# Patient Record
Sex: Male | Born: 1965 | Race: White | Hispanic: No | Marital: Married | Smoking: Never smoker
Health system: Southern US, Community
[De-identification: ages and names within clinical notes are randomized; demographics above are authoritative.]

## PROBLEM LIST (undated history)

## (undated) DIAGNOSIS — K5792 Diverticulitis of intestine, part unspecified, without perforation or abscess without bleeding: Secondary | ICD-10-CM

## (undated) DIAGNOSIS — Z91018 Allergy to other foods: Secondary | ICD-10-CM

## (undated) DIAGNOSIS — E785 Hyperlipidemia, unspecified: Secondary | ICD-10-CM

## (undated) DIAGNOSIS — I493 Ventricular premature depolarization: Secondary | ICD-10-CM

## (undated) HISTORY — DX: Allergy to other foods: Z91.018

## (undated) HISTORY — DX: Diverticulitis of intestine, part unspecified, without perforation or abscess without bleeding: K57.92

## (undated) HISTORY — PX: TONSILLECTOMY: SUR1361

## (undated) HISTORY — DX: Ventricular premature depolarization: I49.3

## (undated) HISTORY — DX: Hyperlipidemia, unspecified: E78.5

## (undated) HISTORY — PX: MENISCUS REPAIR: SHX5179

---

## 2004-11-07 ENCOUNTER — Encounter: Admission: RE | Admit: 2004-11-07 | Discharge: 2004-11-21 | Payer: Self-pay | Admitting: Specialist

## 2005-08-29 ENCOUNTER — Ambulatory Visit: Payer: Self-pay | Admitting: Family Medicine

## 2006-01-16 ENCOUNTER — Ambulatory Visit: Payer: Self-pay | Admitting: Family Medicine

## 2006-05-20 ENCOUNTER — Ambulatory Visit: Payer: Self-pay | Admitting: Family Medicine

## 2011-04-18 ENCOUNTER — Encounter: Payer: Self-pay | Admitting: Cardiology

## 2011-04-18 ENCOUNTER — Ambulatory Visit (INDEPENDENT_AMBULATORY_CARE_PROVIDER_SITE_OTHER): Payer: BC Managed Care – PPO | Admitting: Cardiology

## 2011-04-18 DIAGNOSIS — E785 Hyperlipidemia, unspecified: Secondary | ICD-10-CM | POA: Insufficient documentation

## 2011-04-18 DIAGNOSIS — R0989 Other specified symptoms and signs involving the circulatory and respiratory systems: Secondary | ICD-10-CM

## 2011-04-18 DIAGNOSIS — E7800 Pure hypercholesterolemia, unspecified: Secondary | ICD-10-CM

## 2011-04-18 DIAGNOSIS — R002 Palpitations: Secondary | ICD-10-CM

## 2011-04-18 DIAGNOSIS — R0683 Snoring: Secondary | ICD-10-CM | POA: Insufficient documentation

## 2011-04-18 DIAGNOSIS — G4730 Sleep apnea, unspecified: Secondary | ICD-10-CM

## 2011-04-18 DIAGNOSIS — R0609 Other forms of dyspnea: Secondary | ICD-10-CM

## 2011-04-18 NOTE — Assessment & Plan Note (Signed)
I was able to premature atrial contractions with a conduction. This reproduced his symptoms. At this point is absolutely no evidence of structural heart disease. I advised avoidance of caffeine. I told him that if these are problematic now I would consider symptomatic treatment with beta blockers or calcium blockers. I don't think antiarrhythmics are indicated. No further imaging is indicated. I will check a basic metabolic profile, magnesium and TSH.

## 2011-04-18 NOTE — Assessment & Plan Note (Signed)
He has had elevated lipids and would like followup testing as he thinks he changed his diet. I will arrange this.

## 2011-04-18 NOTE — Patient Instructions (Signed)
Please have fasting labs on Friday in our office.  You are being referred to pulmonary for the evaluation of sleep apnea.

## 2011-04-18 NOTE — Assessment & Plan Note (Signed)
His wife has concerns of sleep apnea. They request a sleep consult and I will arrange this.

## 2011-04-18 NOTE — Progress Notes (Signed)
HPI The patient presents for evaluation of palpitations. He says this is been happening for about 3 or 4 months. He has had no prior cardiac history. He notices staining heartbeats particularly in the evenings when he is resting. He doesn't notice activity. He doesn't describe sustained tachycardia arrhythmias. He feels his heart skipping but has had no presyncope or syncope. He has had no chest pressure, neck or arm discomfort. He said no shortness of breath, weight gain or edema. He denies PND or orthopnea. His wife thinks he might have sleep apnea as he snores. He doesn't describe excessive somnolence, headaches and doesn't have difficulty control blood pressure. He does only sleep about 5 hours a night however. He has had some recent problems with what he believes to be allergic reactions with apparent hives and is to see an allergist.    No Known Allergies  No current outpatient prescriptions on file.    Past Medical History  Diagnosis Date  . Hyperlipidemia     Borderline    Past Surgical History  Procedure Date  . Meniscus repair     Left    Family History  Problem Relation Age of Onset  . Hypertension Father   . Hyperlipidemia Mother   . Congenital heart disease Son     TGV    History   Social History  . Marital Status: Married    Spouse Name: N/A    Number of Children: 3  . Years of Education: N/A   Occupational History  . Roofer    Social History Main Topics  . Smoking status: Never Smoker   . Smokeless tobacco: Not on file  . Alcohol Use: Not on file  . Drug Use: Not on file  . Sexually Active: Not on file   Other Topics Concern  . Not on file   Social History Narrative  . No narrative on file    ROS:  As stated in the HPI and negative for all other systems.  PHYSICAL EXAM BP 112/68  Pulse 67  Resp 16  Ht 6\' 1"  (1.854 m)  Wt 204 lb (92.534 kg)  BMI 26.91 kg/m2 GENERAL:  Well appearing HEENT:  Pupils equal round and reactive, fundi not  visualized, oral mucosa unremarkable NECK:  No jugular venous distention, waveform within normal limits, carotid upstroke brisk and symmetric, no bruits, no thyromegaly LYMPHATICS:  No cervical, inguinal adenopathy LUNGS:  Clear to auscultation bilaterally BACK:  No CVA tenderness CHEST:  Unremarkable HEART:  PMI not displaced or sustained,S1 and S2 within normal limits, no S3, no S4, no clicks, no rubs, no murmurs ABD:  Flat, positive bowel sounds normal in frequency in pitch, no bruits, no rebound, no guarding, no midline pulsatile mass, no hepatomegaly, no splenomegaly EXT:  2 plus pulses throughout, no edema, no cyanosis no clubbing SKIN:  No rashes no nodules NEURO:  Cranial nerves II through XII grossly intact, motor grossly intact throughout PSYCH:  Cognitively intact, oriented to person place and time  EKG:  Sinus rhythm, rate 67, axis within normal limits, intervals within normal limits, no acute ST-T wave changes.   ASSESSMENT AND PLAN

## 2011-04-20 ENCOUNTER — Ambulatory Visit (INDEPENDENT_AMBULATORY_CARE_PROVIDER_SITE_OTHER): Payer: BC Managed Care – PPO | Admitting: *Deleted

## 2011-04-20 DIAGNOSIS — R002 Palpitations: Secondary | ICD-10-CM

## 2011-04-20 DIAGNOSIS — E7800 Pure hypercholesterolemia, unspecified: Secondary | ICD-10-CM

## 2011-04-20 LAB — BASIC METABOLIC PANEL
CO2: 27 mEq/L (ref 19–32)
Glucose, Bld: 87 mg/dL (ref 70–99)
Potassium: 4.1 mEq/L (ref 3.5–5.1)
Sodium: 139 mEq/L (ref 135–145)

## 2011-04-20 LAB — LIPID PANEL
Cholesterol: 231 mg/dL — ABNORMAL HIGH (ref 0–200)
HDL: 52.4 mg/dL (ref >39.00)
VLDL: 13.4 mg/dL (ref 0.0–40.0)

## 2011-04-20 LAB — LDL CHOLESTEROL, DIRECT: Direct LDL: 157 mg/dL

## 2011-04-20 LAB — MAGNESIUM: Magnesium: 2.2 mg/dL (ref 1.5–2.5)

## 2011-04-26 ENCOUNTER — Telehealth: Payer: Self-pay | Admitting: Cardiology

## 2011-04-26 NOTE — Telephone Encounter (Signed)
Test result.  Per pt  Request,  please give information to wife.

## 2011-04-26 NOTE — Telephone Encounter (Signed)
Reviewed all lab results with pt and his wife.  Pt is aware that Lipitor 40 mg a day has been ordered and will be sent into his pharmacy.  He will call back if he should decide to start it so that I can schedule his repeat lab work in 10 weeks

## 2011-05-11 ENCOUNTER — Institutional Professional Consult (permissible substitution): Payer: BC Managed Care – PPO | Admitting: Pulmonary Disease

## 2011-07-10 ENCOUNTER — Emergency Department (HOSPITAL_COMMUNITY)
Admission: EM | Admit: 2011-07-10 | Discharge: 2011-07-10 | Disposition: A | Discharge status: Home / Self Care | Payer: BC Managed Care – PPO | Attending: Emergency Medicine | Admitting: Emergency Medicine

## 2011-07-10 DIAGNOSIS — X58XXXA Exposure to other specified factors, initial encounter: Secondary | ICD-10-CM | POA: Insufficient documentation

## 2011-07-10 DIAGNOSIS — T7840XA Allergy, unspecified, initial encounter: Secondary | ICD-10-CM | POA: Insufficient documentation

## 2011-07-10 DIAGNOSIS — R21 Rash and other nonspecific skin eruption: Secondary | ICD-10-CM | POA: Insufficient documentation

## 2011-07-10 LAB — GLUCOSE, CAPILLARY: Glucose-Capillary: 144 mg/dL — ABNORMAL HIGH (ref 70–99)

## 2016-03-06 DIAGNOSIS — Z889 Allergy status to unspecified drugs, medicaments and biological substances status: Secondary | ICD-10-CM | POA: Diagnosis not present

## 2016-03-06 DIAGNOSIS — Z6829 Body mass index (BMI) 29.0-29.9, adult: Secondary | ICD-10-CM | POA: Diagnosis not present

## 2016-03-06 DIAGNOSIS — M79652 Pain in left thigh: Secondary | ICD-10-CM | POA: Diagnosis not present

## 2016-06-27 DIAGNOSIS — M5136 Other intervertebral disc degeneration, lumbar region: Secondary | ICD-10-CM | POA: Diagnosis not present

## 2016-06-27 DIAGNOSIS — M9903 Segmental and somatic dysfunction of lumbar region: Secondary | ICD-10-CM | POA: Diagnosis not present

## 2016-07-09 DIAGNOSIS — M9903 Segmental and somatic dysfunction of lumbar region: Secondary | ICD-10-CM | POA: Diagnosis not present

## 2016-07-09 DIAGNOSIS — M5136 Other intervertebral disc degeneration, lumbar region: Secondary | ICD-10-CM | POA: Diagnosis not present

## 2016-07-10 DIAGNOSIS — M5136 Other intervertebral disc degeneration, lumbar region: Secondary | ICD-10-CM | POA: Diagnosis not present

## 2016-07-10 DIAGNOSIS — M9903 Segmental and somatic dysfunction of lumbar region: Secondary | ICD-10-CM | POA: Diagnosis not present

## 2016-07-12 DIAGNOSIS — M5136 Other intervertebral disc degeneration, lumbar region: Secondary | ICD-10-CM | POA: Diagnosis not present

## 2016-07-12 DIAGNOSIS — M9903 Segmental and somatic dysfunction of lumbar region: Secondary | ICD-10-CM | POA: Diagnosis not present

## 2016-07-17 DIAGNOSIS — M9903 Segmental and somatic dysfunction of lumbar region: Secondary | ICD-10-CM | POA: Diagnosis not present

## 2016-07-17 DIAGNOSIS — M5136 Other intervertebral disc degeneration, lumbar region: Secondary | ICD-10-CM | POA: Diagnosis not present

## 2016-07-18 DIAGNOSIS — M9903 Segmental and somatic dysfunction of lumbar region: Secondary | ICD-10-CM | POA: Diagnosis not present

## 2016-07-18 DIAGNOSIS — M5136 Other intervertebral disc degeneration, lumbar region: Secondary | ICD-10-CM | POA: Diagnosis not present

## 2016-07-19 DIAGNOSIS — M5136 Other intervertebral disc degeneration, lumbar region: Secondary | ICD-10-CM | POA: Diagnosis not present

## 2016-07-19 DIAGNOSIS — M9903 Segmental and somatic dysfunction of lumbar region: Secondary | ICD-10-CM | POA: Diagnosis not present

## 2016-07-23 DIAGNOSIS — M5136 Other intervertebral disc degeneration, lumbar region: Secondary | ICD-10-CM | POA: Diagnosis not present

## 2016-07-23 DIAGNOSIS — M9903 Segmental and somatic dysfunction of lumbar region: Secondary | ICD-10-CM | POA: Diagnosis not present

## 2016-07-24 DIAGNOSIS — M9903 Segmental and somatic dysfunction of lumbar region: Secondary | ICD-10-CM | POA: Diagnosis not present

## 2016-07-24 DIAGNOSIS — M5136 Other intervertebral disc degeneration, lumbar region: Secondary | ICD-10-CM | POA: Diagnosis not present

## 2016-07-24 DIAGNOSIS — H524 Presbyopia: Secondary | ICD-10-CM | POA: Diagnosis not present

## 2016-07-24 DIAGNOSIS — H5203 Hypermetropia, bilateral: Secondary | ICD-10-CM | POA: Diagnosis not present

## 2016-07-25 DIAGNOSIS — M5136 Other intervertebral disc degeneration, lumbar region: Secondary | ICD-10-CM | POA: Diagnosis not present

## 2016-07-25 DIAGNOSIS — M9903 Segmental and somatic dysfunction of lumbar region: Secondary | ICD-10-CM | POA: Diagnosis not present

## 2016-07-30 DIAGNOSIS — M9903 Segmental and somatic dysfunction of lumbar region: Secondary | ICD-10-CM | POA: Diagnosis not present

## 2016-07-30 DIAGNOSIS — M5136 Other intervertebral disc degeneration, lumbar region: Secondary | ICD-10-CM | POA: Diagnosis not present

## 2016-07-31 DIAGNOSIS — M9903 Segmental and somatic dysfunction of lumbar region: Secondary | ICD-10-CM | POA: Diagnosis not present

## 2016-07-31 DIAGNOSIS — M5136 Other intervertebral disc degeneration, lumbar region: Secondary | ICD-10-CM | POA: Diagnosis not present

## 2016-08-01 DIAGNOSIS — M5136 Other intervertebral disc degeneration, lumbar region: Secondary | ICD-10-CM | POA: Diagnosis not present

## 2016-08-01 DIAGNOSIS — M9903 Segmental and somatic dysfunction of lumbar region: Secondary | ICD-10-CM | POA: Diagnosis not present

## 2016-08-02 DIAGNOSIS — M9903 Segmental and somatic dysfunction of lumbar region: Secondary | ICD-10-CM | POA: Diagnosis not present

## 2016-08-02 DIAGNOSIS — M5136 Other intervertebral disc degeneration, lumbar region: Secondary | ICD-10-CM | POA: Diagnosis not present

## 2016-08-06 DIAGNOSIS — M5136 Other intervertebral disc degeneration, lumbar region: Secondary | ICD-10-CM | POA: Diagnosis not present

## 2016-08-06 DIAGNOSIS — M9903 Segmental and somatic dysfunction of lumbar region: Secondary | ICD-10-CM | POA: Diagnosis not present

## 2016-08-07 DIAGNOSIS — M5136 Other intervertebral disc degeneration, lumbar region: Secondary | ICD-10-CM | POA: Diagnosis not present

## 2016-08-07 DIAGNOSIS — M9903 Segmental and somatic dysfunction of lumbar region: Secondary | ICD-10-CM | POA: Diagnosis not present

## 2016-08-09 DIAGNOSIS — M9903 Segmental and somatic dysfunction of lumbar region: Secondary | ICD-10-CM | POA: Diagnosis not present

## 2016-08-09 DIAGNOSIS — M5136 Other intervertebral disc degeneration, lumbar region: Secondary | ICD-10-CM | POA: Diagnosis not present

## 2016-08-13 DIAGNOSIS — M5136 Other intervertebral disc degeneration, lumbar region: Secondary | ICD-10-CM | POA: Diagnosis not present

## 2016-08-13 DIAGNOSIS — M9903 Segmental and somatic dysfunction of lumbar region: Secondary | ICD-10-CM | POA: Diagnosis not present

## 2016-08-14 DIAGNOSIS — M5136 Other intervertebral disc degeneration, lumbar region: Secondary | ICD-10-CM | POA: Diagnosis not present

## 2016-08-14 DIAGNOSIS — M9903 Segmental and somatic dysfunction of lumbar region: Secondary | ICD-10-CM | POA: Diagnosis not present

## 2016-08-20 DIAGNOSIS — M9903 Segmental and somatic dysfunction of lumbar region: Secondary | ICD-10-CM | POA: Diagnosis not present

## 2016-08-20 DIAGNOSIS — M5136 Other intervertebral disc degeneration, lumbar region: Secondary | ICD-10-CM | POA: Diagnosis not present

## 2016-08-22 DIAGNOSIS — M5136 Other intervertebral disc degeneration, lumbar region: Secondary | ICD-10-CM | POA: Diagnosis not present

## 2016-08-22 DIAGNOSIS — M9903 Segmental and somatic dysfunction of lumbar region: Secondary | ICD-10-CM | POA: Diagnosis not present

## 2016-08-23 DIAGNOSIS — M9903 Segmental and somatic dysfunction of lumbar region: Secondary | ICD-10-CM | POA: Diagnosis not present

## 2016-08-23 DIAGNOSIS — M5136 Other intervertebral disc degeneration, lumbar region: Secondary | ICD-10-CM | POA: Diagnosis not present

## 2016-08-27 DIAGNOSIS — M5136 Other intervertebral disc degeneration, lumbar region: Secondary | ICD-10-CM | POA: Diagnosis not present

## 2016-08-27 DIAGNOSIS — M9903 Segmental and somatic dysfunction of lumbar region: Secondary | ICD-10-CM | POA: Diagnosis not present

## 2016-08-29 DIAGNOSIS — M5136 Other intervertebral disc degeneration, lumbar region: Secondary | ICD-10-CM | POA: Diagnosis not present

## 2016-08-29 DIAGNOSIS — M9903 Segmental and somatic dysfunction of lumbar region: Secondary | ICD-10-CM | POA: Diagnosis not present

## 2016-08-30 DIAGNOSIS — M9903 Segmental and somatic dysfunction of lumbar region: Secondary | ICD-10-CM | POA: Diagnosis not present

## 2016-08-30 DIAGNOSIS — M5136 Other intervertebral disc degeneration, lumbar region: Secondary | ICD-10-CM | POA: Diagnosis not present

## 2016-09-03 DIAGNOSIS — M9903 Segmental and somatic dysfunction of lumbar region: Secondary | ICD-10-CM | POA: Diagnosis not present

## 2016-09-03 DIAGNOSIS — M5136 Other intervertebral disc degeneration, lumbar region: Secondary | ICD-10-CM | POA: Diagnosis not present

## 2016-09-04 DIAGNOSIS — M5136 Other intervertebral disc degeneration, lumbar region: Secondary | ICD-10-CM | POA: Diagnosis not present

## 2016-09-04 DIAGNOSIS — M9903 Segmental and somatic dysfunction of lumbar region: Secondary | ICD-10-CM | POA: Diagnosis not present

## 2016-09-20 DIAGNOSIS — M5136 Other intervertebral disc degeneration, lumbar region: Secondary | ICD-10-CM | POA: Diagnosis not present

## 2016-09-20 DIAGNOSIS — M9903 Segmental and somatic dysfunction of lumbar region: Secondary | ICD-10-CM | POA: Diagnosis not present

## 2016-09-25 DIAGNOSIS — M5136 Other intervertebral disc degeneration, lumbar region: Secondary | ICD-10-CM | POA: Diagnosis not present

## 2016-09-25 DIAGNOSIS — M9903 Segmental and somatic dysfunction of lumbar region: Secondary | ICD-10-CM | POA: Diagnosis not present

## 2016-09-26 DIAGNOSIS — M5136 Other intervertebral disc degeneration, lumbar region: Secondary | ICD-10-CM | POA: Diagnosis not present

## 2016-09-26 DIAGNOSIS — M9903 Segmental and somatic dysfunction of lumbar region: Secondary | ICD-10-CM | POA: Diagnosis not present

## 2016-09-27 DIAGNOSIS — M5136 Other intervertebral disc degeneration, lumbar region: Secondary | ICD-10-CM | POA: Diagnosis not present

## 2016-09-27 DIAGNOSIS — M9903 Segmental and somatic dysfunction of lumbar region: Secondary | ICD-10-CM | POA: Diagnosis not present

## 2016-10-01 DIAGNOSIS — M5136 Other intervertebral disc degeneration, lumbar region: Secondary | ICD-10-CM | POA: Diagnosis not present

## 2016-10-01 DIAGNOSIS — M9903 Segmental and somatic dysfunction of lumbar region: Secondary | ICD-10-CM | POA: Diagnosis not present

## 2016-10-02 DIAGNOSIS — M5136 Other intervertebral disc degeneration, lumbar region: Secondary | ICD-10-CM | POA: Diagnosis not present

## 2016-10-02 DIAGNOSIS — M9903 Segmental and somatic dysfunction of lumbar region: Secondary | ICD-10-CM | POA: Diagnosis not present

## 2016-10-04 DIAGNOSIS — M9903 Segmental and somatic dysfunction of lumbar region: Secondary | ICD-10-CM | POA: Diagnosis not present

## 2016-10-04 DIAGNOSIS — M5136 Other intervertebral disc degeneration, lumbar region: Secondary | ICD-10-CM | POA: Diagnosis not present

## 2016-10-08 DIAGNOSIS — M9903 Segmental and somatic dysfunction of lumbar region: Secondary | ICD-10-CM | POA: Diagnosis not present

## 2016-10-08 DIAGNOSIS — M5136 Other intervertebral disc degeneration, lumbar region: Secondary | ICD-10-CM | POA: Diagnosis not present

## 2016-10-09 DIAGNOSIS — M9903 Segmental and somatic dysfunction of lumbar region: Secondary | ICD-10-CM | POA: Diagnosis not present

## 2016-10-09 DIAGNOSIS — M5136 Other intervertebral disc degeneration, lumbar region: Secondary | ICD-10-CM | POA: Diagnosis not present

## 2016-10-15 DIAGNOSIS — M9903 Segmental and somatic dysfunction of lumbar region: Secondary | ICD-10-CM | POA: Diagnosis not present

## 2016-10-15 DIAGNOSIS — M5136 Other intervertebral disc degeneration, lumbar region: Secondary | ICD-10-CM | POA: Diagnosis not present

## 2016-10-17 DIAGNOSIS — M9903 Segmental and somatic dysfunction of lumbar region: Secondary | ICD-10-CM | POA: Diagnosis not present

## 2016-10-17 DIAGNOSIS — M5136 Other intervertebral disc degeneration, lumbar region: Secondary | ICD-10-CM | POA: Diagnosis not present

## 2016-10-29 DIAGNOSIS — M9903 Segmental and somatic dysfunction of lumbar region: Secondary | ICD-10-CM | POA: Diagnosis not present

## 2016-10-29 DIAGNOSIS — M5136 Other intervertebral disc degeneration, lumbar region: Secondary | ICD-10-CM | POA: Diagnosis not present

## 2016-10-31 DIAGNOSIS — M5136 Other intervertebral disc degeneration, lumbar region: Secondary | ICD-10-CM | POA: Diagnosis not present

## 2016-10-31 DIAGNOSIS — M9903 Segmental and somatic dysfunction of lumbar region: Secondary | ICD-10-CM | POA: Diagnosis not present

## 2016-11-01 DIAGNOSIS — M9903 Segmental and somatic dysfunction of lumbar region: Secondary | ICD-10-CM | POA: Diagnosis not present

## 2016-11-01 DIAGNOSIS — M5136 Other intervertebral disc degeneration, lumbar region: Secondary | ICD-10-CM | POA: Diagnosis not present

## 2016-11-05 DIAGNOSIS — M5136 Other intervertebral disc degeneration, lumbar region: Secondary | ICD-10-CM | POA: Diagnosis not present

## 2016-11-05 DIAGNOSIS — M9903 Segmental and somatic dysfunction of lumbar region: Secondary | ICD-10-CM | POA: Diagnosis not present

## 2016-11-06 DIAGNOSIS — M5136 Other intervertebral disc degeneration, lumbar region: Secondary | ICD-10-CM | POA: Diagnosis not present

## 2016-11-06 DIAGNOSIS — M9903 Segmental and somatic dysfunction of lumbar region: Secondary | ICD-10-CM | POA: Diagnosis not present

## 2016-11-20 DIAGNOSIS — M9903 Segmental and somatic dysfunction of lumbar region: Secondary | ICD-10-CM | POA: Diagnosis not present

## 2016-11-20 DIAGNOSIS — M5136 Other intervertebral disc degeneration, lumbar region: Secondary | ICD-10-CM | POA: Diagnosis not present

## 2016-11-27 DIAGNOSIS — M5136 Other intervertebral disc degeneration, lumbar region: Secondary | ICD-10-CM | POA: Diagnosis not present

## 2016-11-27 DIAGNOSIS — M9903 Segmental and somatic dysfunction of lumbar region: Secondary | ICD-10-CM | POA: Diagnosis not present

## 2016-11-29 DIAGNOSIS — M5136 Other intervertebral disc degeneration, lumbar region: Secondary | ICD-10-CM | POA: Diagnosis not present

## 2016-11-29 DIAGNOSIS — M9903 Segmental and somatic dysfunction of lumbar region: Secondary | ICD-10-CM | POA: Diagnosis not present

## 2016-12-04 DIAGNOSIS — M5136 Other intervertebral disc degeneration, lumbar region: Secondary | ICD-10-CM | POA: Diagnosis not present

## 2016-12-04 DIAGNOSIS — M9903 Segmental and somatic dysfunction of lumbar region: Secondary | ICD-10-CM | POA: Diagnosis not present

## 2016-12-05 DIAGNOSIS — M5136 Other intervertebral disc degeneration, lumbar region: Secondary | ICD-10-CM | POA: Diagnosis not present

## 2016-12-05 DIAGNOSIS — M9903 Segmental and somatic dysfunction of lumbar region: Secondary | ICD-10-CM | POA: Diagnosis not present

## 2016-12-10 DIAGNOSIS — M9903 Segmental and somatic dysfunction of lumbar region: Secondary | ICD-10-CM | POA: Diagnosis not present

## 2016-12-10 DIAGNOSIS — M5136 Other intervertebral disc degeneration, lumbar region: Secondary | ICD-10-CM | POA: Diagnosis not present

## 2016-12-12 DIAGNOSIS — M5136 Other intervertebral disc degeneration, lumbar region: Secondary | ICD-10-CM | POA: Diagnosis not present

## 2016-12-12 DIAGNOSIS — M9903 Segmental and somatic dysfunction of lumbar region: Secondary | ICD-10-CM | POA: Diagnosis not present

## 2016-12-13 DIAGNOSIS — M9903 Segmental and somatic dysfunction of lumbar region: Secondary | ICD-10-CM | POA: Diagnosis not present

## 2016-12-13 DIAGNOSIS — M5136 Other intervertebral disc degeneration, lumbar region: Secondary | ICD-10-CM | POA: Diagnosis not present

## 2016-12-18 DIAGNOSIS — M5136 Other intervertebral disc degeneration, lumbar region: Secondary | ICD-10-CM | POA: Diagnosis not present

## 2016-12-18 DIAGNOSIS — M9903 Segmental and somatic dysfunction of lumbar region: Secondary | ICD-10-CM | POA: Diagnosis not present

## 2016-12-19 DIAGNOSIS — M5136 Other intervertebral disc degeneration, lumbar region: Secondary | ICD-10-CM | POA: Diagnosis not present

## 2016-12-19 DIAGNOSIS — M9903 Segmental and somatic dysfunction of lumbar region: Secondary | ICD-10-CM | POA: Diagnosis not present

## 2016-12-24 DIAGNOSIS — M5136 Other intervertebral disc degeneration, lumbar region: Secondary | ICD-10-CM | POA: Diagnosis not present

## 2016-12-24 DIAGNOSIS — M9903 Segmental and somatic dysfunction of lumbar region: Secondary | ICD-10-CM | POA: Diagnosis not present

## 2016-12-26 DIAGNOSIS — M5136 Other intervertebral disc degeneration, lumbar region: Secondary | ICD-10-CM | POA: Diagnosis not present

## 2016-12-26 DIAGNOSIS — M9903 Segmental and somatic dysfunction of lumbar region: Secondary | ICD-10-CM | POA: Diagnosis not present

## 2017-01-01 DIAGNOSIS — M5136 Other intervertebral disc degeneration, lumbar region: Secondary | ICD-10-CM | POA: Diagnosis not present

## 2017-01-01 DIAGNOSIS — M9903 Segmental and somatic dysfunction of lumbar region: Secondary | ICD-10-CM | POA: Diagnosis not present

## 2017-01-03 DIAGNOSIS — M9903 Segmental and somatic dysfunction of lumbar region: Secondary | ICD-10-CM | POA: Diagnosis not present

## 2017-01-03 DIAGNOSIS — M5136 Other intervertebral disc degeneration, lumbar region: Secondary | ICD-10-CM | POA: Diagnosis not present

## 2017-01-14 DIAGNOSIS — M9903 Segmental and somatic dysfunction of lumbar region: Secondary | ICD-10-CM | POA: Diagnosis not present

## 2017-01-14 DIAGNOSIS — M5136 Other intervertebral disc degeneration, lumbar region: Secondary | ICD-10-CM | POA: Diagnosis not present

## 2017-01-24 DIAGNOSIS — M5136 Other intervertebral disc degeneration, lumbar region: Secondary | ICD-10-CM | POA: Diagnosis not present

## 2017-01-24 DIAGNOSIS — M9903 Segmental and somatic dysfunction of lumbar region: Secondary | ICD-10-CM | POA: Diagnosis not present

## 2017-02-11 DIAGNOSIS — M9903 Segmental and somatic dysfunction of lumbar region: Secondary | ICD-10-CM | POA: Diagnosis not present

## 2017-02-11 DIAGNOSIS — M5136 Other intervertebral disc degeneration, lumbar region: Secondary | ICD-10-CM | POA: Diagnosis not present

## 2017-02-26 DIAGNOSIS — M5136 Other intervertebral disc degeneration, lumbar region: Secondary | ICD-10-CM | POA: Diagnosis not present

## 2017-02-26 DIAGNOSIS — M9903 Segmental and somatic dysfunction of lumbar region: Secondary | ICD-10-CM | POA: Diagnosis not present

## 2017-06-24 DIAGNOSIS — M5136 Other intervertebral disc degeneration, lumbar region: Secondary | ICD-10-CM | POA: Diagnosis not present

## 2017-06-24 DIAGNOSIS — M9903 Segmental and somatic dysfunction of lumbar region: Secondary | ICD-10-CM | POA: Diagnosis not present

## 2017-06-26 DIAGNOSIS — M9903 Segmental and somatic dysfunction of lumbar region: Secondary | ICD-10-CM | POA: Diagnosis not present

## 2017-06-26 DIAGNOSIS — M5136 Other intervertebral disc degeneration, lumbar region: Secondary | ICD-10-CM | POA: Diagnosis not present

## 2017-07-01 DIAGNOSIS — M5136 Other intervertebral disc degeneration, lumbar region: Secondary | ICD-10-CM | POA: Diagnosis not present

## 2017-07-01 DIAGNOSIS — M9903 Segmental and somatic dysfunction of lumbar region: Secondary | ICD-10-CM | POA: Diagnosis not present

## 2017-07-08 DIAGNOSIS — M9903 Segmental and somatic dysfunction of lumbar region: Secondary | ICD-10-CM | POA: Diagnosis not present

## 2017-07-08 DIAGNOSIS — M5136 Other intervertebral disc degeneration, lumbar region: Secondary | ICD-10-CM | POA: Diagnosis not present

## 2017-07-11 DIAGNOSIS — M9903 Segmental and somatic dysfunction of lumbar region: Secondary | ICD-10-CM | POA: Diagnosis not present

## 2017-07-11 DIAGNOSIS — M5136 Other intervertebral disc degeneration, lumbar region: Secondary | ICD-10-CM | POA: Diagnosis not present

## 2017-07-16 DIAGNOSIS — M9903 Segmental and somatic dysfunction of lumbar region: Secondary | ICD-10-CM | POA: Diagnosis not present

## 2017-07-16 DIAGNOSIS — M5136 Other intervertebral disc degeneration, lumbar region: Secondary | ICD-10-CM | POA: Diagnosis not present

## 2017-07-18 DIAGNOSIS — M5136 Other intervertebral disc degeneration, lumbar region: Secondary | ICD-10-CM | POA: Diagnosis not present

## 2017-07-18 DIAGNOSIS — M9903 Segmental and somatic dysfunction of lumbar region: Secondary | ICD-10-CM | POA: Diagnosis not present

## 2017-07-23 DIAGNOSIS — M9903 Segmental and somatic dysfunction of lumbar region: Secondary | ICD-10-CM | POA: Diagnosis not present

## 2017-07-23 DIAGNOSIS — M5136 Other intervertebral disc degeneration, lumbar region: Secondary | ICD-10-CM | POA: Diagnosis not present

## 2018-01-14 DIAGNOSIS — H1089 Other conjunctivitis: Secondary | ICD-10-CM | POA: Diagnosis not present

## 2018-01-14 DIAGNOSIS — R599 Enlarged lymph nodes, unspecified: Secondary | ICD-10-CM | POA: Diagnosis not present

## 2018-01-14 DIAGNOSIS — K0889 Other specified disorders of teeth and supporting structures: Secondary | ICD-10-CM | POA: Diagnosis not present

## 2018-01-22 DIAGNOSIS — Z125 Encounter for screening for malignant neoplasm of prostate: Secondary | ICD-10-CM | POA: Diagnosis not present

## 2018-01-22 DIAGNOSIS — Z0000 Encounter for general adult medical examination without abnormal findings: Secondary | ICD-10-CM | POA: Diagnosis not present

## 2018-01-22 DIAGNOSIS — M2550 Pain in unspecified joint: Secondary | ICD-10-CM | POA: Diagnosis not present

## 2018-01-28 DIAGNOSIS — Z1389 Encounter for screening for other disorder: Secondary | ICD-10-CM | POA: Diagnosis not present

## 2018-01-28 DIAGNOSIS — J45909 Unspecified asthma, uncomplicated: Secondary | ICD-10-CM | POA: Diagnosis not present

## 2018-01-28 DIAGNOSIS — Z0000 Encounter for general adult medical examination without abnormal findings: Secondary | ICD-10-CM | POA: Diagnosis not present

## 2018-01-28 DIAGNOSIS — E7849 Other hyperlipidemia: Secondary | ICD-10-CM | POA: Diagnosis not present

## 2018-01-28 DIAGNOSIS — R590 Localized enlarged lymph nodes: Secondary | ICD-10-CM | POA: Diagnosis not present

## 2018-01-28 DIAGNOSIS — R0683 Snoring: Secondary | ICD-10-CM | POA: Diagnosis not present

## 2018-06-11 DIAGNOSIS — M7711 Lateral epicondylitis, right elbow: Secondary | ICD-10-CM | POA: Diagnosis not present

## 2019-01-29 DIAGNOSIS — E7849 Other hyperlipidemia: Secondary | ICD-10-CM | POA: Diagnosis not present

## 2019-01-29 DIAGNOSIS — Z125 Encounter for screening for malignant neoplasm of prostate: Secondary | ICD-10-CM | POA: Diagnosis not present

## 2019-01-29 DIAGNOSIS — R002 Palpitations: Secondary | ICD-10-CM | POA: Diagnosis not present

## 2019-02-03 ENCOUNTER — Encounter: Payer: Self-pay | Admitting: Internal Medicine

## 2019-02-03 DIAGNOSIS — R002 Palpitations: Secondary | ICD-10-CM | POA: Diagnosis not present

## 2019-02-03 DIAGNOSIS — W57XXXA Bitten or stung by nonvenomous insect and other nonvenomous arthropods, initial encounter: Secondary | ICD-10-CM | POA: Diagnosis not present

## 2019-02-03 DIAGNOSIS — L539 Erythematous condition, unspecified: Secondary | ICD-10-CM | POA: Diagnosis not present

## 2019-02-05 DIAGNOSIS — Z889 Allergy status to unspecified drugs, medicaments and biological substances status: Secondary | ICD-10-CM | POA: Diagnosis not present

## 2019-02-05 DIAGNOSIS — Z0000 Encounter for general adult medical examination without abnormal findings: Secondary | ICD-10-CM | POA: Diagnosis not present

## 2019-02-05 DIAGNOSIS — R002 Palpitations: Secondary | ICD-10-CM | POA: Diagnosis not present

## 2019-02-05 DIAGNOSIS — Z1331 Encounter for screening for depression: Secondary | ICD-10-CM | POA: Diagnosis not present

## 2019-02-05 DIAGNOSIS — E785 Hyperlipidemia, unspecified: Secondary | ICD-10-CM | POA: Diagnosis not present

## 2019-02-05 DIAGNOSIS — W57XXXA Bitten or stung by nonvenomous insect and other nonvenomous arthropods, initial encounter: Secondary | ICD-10-CM | POA: Diagnosis not present

## 2019-02-09 ENCOUNTER — Telehealth: Payer: Self-pay | Admitting: *Deleted

## 2019-02-09 NOTE — Telephone Encounter (Signed)
REFERRAL SENT TO SCHEDULING AND NOTES ON FILE FROM [facility] [phone] DR. AVVA

## 2019-02-11 ENCOUNTER — Encounter: Payer: Self-pay | Admitting: Gastroenterology

## 2019-02-12 ENCOUNTER — Encounter: Payer: Self-pay | Admitting: Gastroenterology

## 2019-02-27 ENCOUNTER — Ambulatory Visit (AMBULATORY_SURGERY_CENTER): Payer: Self-pay | Admitting: *Deleted

## 2019-02-27 ENCOUNTER — Other Ambulatory Visit: Payer: Self-pay

## 2019-02-27 VITALS — Ht 71.0 in | Wt 190.0 lb

## 2019-02-27 DIAGNOSIS — Z1211 Encounter for screening for malignant neoplasm of colon: Secondary | ICD-10-CM

## 2019-02-27 MED ORDER — NA SULFATE-K SULFATE-MG SULF 17.5-3.13-1.6 GM/177ML PO SOLN: ORAL | 0 refills | Status: DC

## 2019-02-27 NOTE — Progress Notes (Signed)
Patient's pre-visit was done today over the phone with the patient due to Covid-19. Name,DOB and address verified. Insurance verified. Packet of Prep instructions mailed to patient including copy of a consent form and pre-procedure patient acknowledgement form and suprep coupon-pt is aware. Patient understands to call us back with any questions or concerns.   Patient denies any allergies to eggs or soy. Patient denies any problems with anesthesia/sedation. Patient denies any oxygen use at home. Patient denies taking any diet/weight loss medications or blood thinners. EMMI education assisgned to patient on colonoscopy, this was explained and instructions given to patient.  Patient having palpitations for 6-8 months, he has an cardiologist office visit with Dr.Allred on Monday 03/02/2019. Patient aware to get cardiac clearance from the dr before colonoscopy can be done.  He is aware to call us back after the appointment to let us know if colonoscopy can be done.

## 2019-03-02 ENCOUNTER — Encounter: Payer: Self-pay | Admitting: Internal Medicine

## 2019-03-02 ENCOUNTER — Telehealth (INDEPENDENT_AMBULATORY_CARE_PROVIDER_SITE_OTHER): Payer: BC Managed Care – PPO | Admitting: Internal Medicine

## 2019-03-02 ENCOUNTER — Other Ambulatory Visit: Payer: Self-pay

## 2019-03-02 ENCOUNTER — Telehealth: Payer: Self-pay

## 2019-03-02 VITALS — BP 115/73 | HR 54 | Ht 71.0 in | Wt 195.0 lb

## 2019-03-02 DIAGNOSIS — R002 Palpitations: Secondary | ICD-10-CM

## 2019-03-02 DIAGNOSIS — I493 Ventricular premature depolarization: Secondary | ICD-10-CM

## 2019-03-02 NOTE — Progress Notes (Signed)
Electrophysiology TeleHealth Note   Due to national recommendations of social distancing due to [place] 19, Audio/video telehealth visit is felt to be most appropriate for this patient at this time.  See MyChart message from today for patient consent regarding telehealth for [facility]   Date:  03/02/2019   ID:  Nathan Calhoun, DOB 06/30/1966, MRN [id number]  Location: home  Provider location: [place] Evaluation Performed: New patient consult  PCP:  Prince Solian, MD  Cardiologist:  Previously Dr Percival Spanish Electrophysiologist:  None   Chief Complaint:  palpitations  History of Present Illness:    Nathan Calhoun is a 53 y.o. male who presents via audio/video conferencing for a telehealth visit today.   The patient is referred for new consultation regarding PVCs by Dr Dagmar Hait.   He reports that he has had these palpitations "for years" intermittently.  He describes "skipped beats".   These will occur multiple times in a minute and seem to be happening daily.  He is unaware of palpitations or triggers.  He finds these anxiety provoking.  He has very rare dizziness with episodes.  He describes this is a "head rush".  He thinks that rest may help them to improve.  He has been prescribed toprol but has not taken this medicine yet.  He is reluctant to try medicines. He has tried reducing caffeine and increasing hydration.  Today, he denies symptoms of chest pain, shortness of breath, orthopnea, PND, lower extremity edema, claudication, presyncope, syncope, bleeding, or neurologic sequela. The patient is tolerating medications without difficulties and is otherwise without complaint today.   he denies symptoms of cough, fevers, chills, or new SOB worrisome for COVID 19.   Past Medical History:  Diagnosis Date  . Allergy to alpha-gal   . Hyperlipidemia    Borderline  . Premature ventricular contractions     Past Surgical History:  Procedure Laterality Date  . MENISCUS REPAIR     Left  . TONSILLECTOMY      No current outpatient medications on file.   No current facility-administered medications for this visit.     Allergies:   Patient has no known allergies.   Social History:  The patient  reports that he has never smoked. He has never used smokeless tobacco. He reports current alcohol use of about 3.0 standard drinks of alcohol per week. He reports previous drug use.   Family History:  The patient's family history includes Congenital heart disease in his son; Hyperlipidemia in his mother; Hypertension in his father.  Father died at home suddenly.  Maternal GF had "massive heart attack".   ROS:  Please see the history of present illness.   All other systems are personally reviewed and negative.    Exam:    Vital Signs:  BP 115/73   Pulse (!) 54   Ht 5\' 11"  (1.803 m)   Wt 195 lb (88.5 kg)   BMI 27.20 kg/m    Well appearing, alert and conversant, regular work of breathing,  good skin color Eyes- anicteric, neuro- grossly intact, skin- no apparent rash or lesions or cyanosis, mouth- oral mucosa is pink   Labs/Other Tests and Data Reviewed:    Recent Labs: No results found for requested labs within last 8760 hours.   Wt Readings from Last 3 Encounters:  03/02/19 195 lb (88.5 kg)  02/27/19 190 lb (86.2 kg)  04/18/11 204 lb (92.5 kg)     Other studies personally reviewed: Additional studies/ records that  were reviewed today include: notes from Dr Vicente MalesAvva's office  Review of the above records today demonstrates: as above  ASSESSMENT & PLAN:    1.  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  Stress reduction, ETOH avoidance, adequate sleep encouraged  Consider further CV risk stratification with ETT vs cardiac CT when able  2. COVID screen The patient does not have any symptoms that suggest any further testing/ screening at this  time.  Social distancing reinforced today.  Follow-up in office with me in 4 weeks  Current medicines are reviewed at length with the patient today.   The patient does not have concerns regarding his medicines.  The following changes were made today:  none  Labs/ tests ordered today include:  No orders of the defined types were placed in this encounter.  Patient Risk:  after full review of this patients clinical status, I feel that they are at moderate risk at this time.   Today, I have spent 20 minutes with the patient with telehealth technology discussing PVCs, palpitations .    Signed, Hillis RangeJames Jowan Skillin MD, [facility] 03/02/2019 9:26 AM   [facility] [address] [place] [address] [phone] (office) [phone] (fax)

## 2019-03-11 ENCOUNTER — Encounter (HOSPITAL_COMMUNITY): Payer: Self-pay | Admitting: Internal Medicine

## 2019-03-11 ENCOUNTER — Encounter: Payer: Self-pay | Admitting: *Deleted

## 2019-03-11 NOTE — Progress Notes (Signed)
Patient ID: Nathan Calhoun, male   DOB: 01/29/1966, 53 y.o.   MRN: [id number] 3 day ZIO XT long term holter monitor mailed to patient on 03/02/2019.

## 2019-03-13 ENCOUNTER — Encounter: Payer: Self-pay | Admitting: Gastroenterology

## 2019-03-19 ENCOUNTER — Ambulatory Visit (INDEPENDENT_AMBULATORY_CARE_PROVIDER_SITE_OTHER): Payer: BC Managed Care – PPO

## 2019-03-19 DIAGNOSIS — I493 Ventricular premature depolarization: Secondary | ICD-10-CM

## 2019-03-19 DIAGNOSIS — R002 Palpitations: Secondary | ICD-10-CM

## 2019-03-31 ENCOUNTER — Other Ambulatory Visit: Payer: Self-pay

## 2019-04-01 ENCOUNTER — Telehealth: Payer: BC Managed Care – PPO | Admitting: Internal Medicine

## 2019-04-06 ENCOUNTER — Other Ambulatory Visit: Payer: Self-pay

## 2019-04-06 ENCOUNTER — Ambulatory Visit (HOSPITAL_COMMUNITY): Payer: BC Managed Care – PPO | Attending: Internal Medicine

## 2019-04-06 DIAGNOSIS — R002 Palpitations: Secondary | ICD-10-CM | POA: Diagnosis not present

## 2019-04-06 DIAGNOSIS — I493 Ventricular premature depolarization: Secondary | ICD-10-CM

## 2019-04-08 ENCOUNTER — Telehealth: Payer: Self-pay

## 2019-04-08 NOTE — Telephone Encounter (Signed)
Left message advising HM results were released via mychart.  Advised Pt could call office or send this nurse a message via mychart for results.

## 2019-04-08 NOTE — Telephone Encounter (Signed)
-----   Message from Thompson Grayer, MD sent at 04/05/2019 10:09 PM EDT ----- Results reviewed.  Nathan Calhoun, please inform pt of result.

## 2019-04-27 ENCOUNTER — Telehealth: Payer: Self-pay

## 2019-04-27 DIAGNOSIS — Q208 Other congenital malformations of cardiac chambers and connections: Secondary | ICD-10-CM

## 2019-04-27 NOTE — Telephone Encounter (Signed)
-----   Message from Thompson Grayer, MD sent at 04/26/2019  9:36 PM EDT ----- Results reviewed.  Sonia Baller, please inform pt of result.  LV is moderately enlarged.  Please obtain cardiac MRI to further evaluate structural heart disease. I will route to primary care also.

## 2019-04-27 NOTE — Telephone Encounter (Signed)
Call placed to Pt and advised of ECHO results and per Dr. Rayann Heman would like a cardiac MRI to further evaluate enlarged LV.    Sent to precert.

## 2019-05-05 ENCOUNTER — Encounter: Payer: Self-pay | Admitting: Internal Medicine

## 2019-05-13 ENCOUNTER — Encounter: Payer: Self-pay | Admitting: *Deleted

## 2019-05-20 ENCOUNTER — Ambulatory Visit: Payer: BC Managed Care – PPO | Admitting: Internal Medicine

## 2019-05-21 ENCOUNTER — Other Ambulatory Visit (HOSPITAL_COMMUNITY): Payer: BC Managed Care – PPO

## 2019-05-28 ENCOUNTER — Telehealth (HOSPITAL_COMMUNITY): Payer: Self-pay | Admitting: Emergency Medicine

## 2019-05-28 NOTE — Telephone Encounter (Signed)
Reaching out to patient to offer assistance regarding upcoming cardiac imaging study; pt verbalizes understanding of appt date/time, parking situation and where to check in,  and verified current allergies; name and call back number provided for further questions should they arise Marchia Bond RN Navigator Cardiac Imaging [facility] [phone] office [phone] cell  Pt states he has a piece of "chipped hammer" in his arm at medial elbow, size of grain of rice  Pt also states he "crawls under houses for living so claustrophobia shouldn't be an issue"

## 2019-05-29 ENCOUNTER — Other Ambulatory Visit: Payer: Self-pay

## 2019-05-29 ENCOUNTER — Ambulatory Visit (HOSPITAL_COMMUNITY)
Admission: RE | Admit: 2019-05-29 | Discharge: 2019-05-29 | Disposition: A | Discharge status: Home / Self Care | Payer: BC Managed Care – PPO | Source: Ambulatory Visit | Attending: Internal Medicine | Admitting: Internal Medicine

## 2019-05-29 DIAGNOSIS — Q208 Other congenital malformations of cardiac chambers and connections: Secondary | ICD-10-CM

## 2019-05-29 MED ORDER — GADOBUTROL 1 MMOL/ML IV SOLN
12.0000 mL | Freq: Once | INTRAVENOUS | Status: AC | PRN
  Administered 2019-05-29: 12 mL via INTRAVENOUS

## 2019-06-12 ENCOUNTER — Telehealth (INDEPENDENT_AMBULATORY_CARE_PROVIDER_SITE_OTHER): Payer: BC Managed Care – PPO | Admitting: Internal Medicine

## 2019-06-12 ENCOUNTER — Encounter: Payer: Self-pay | Admitting: Internal Medicine

## 2019-06-12 VITALS — BP 123/68 | HR 68 | Ht 71.0 in | Wt 195.0 lb

## 2019-06-12 DIAGNOSIS — R0683 Snoring: Secondary | ICD-10-CM

## 2019-06-12 DIAGNOSIS — I493 Ventricular premature depolarization: Secondary | ICD-10-CM | POA: Diagnosis not present

## 2019-06-12 DIAGNOSIS — E7800 Pure hypercholesterolemia, unspecified: Secondary | ICD-10-CM

## 2019-06-12 NOTE — Progress Notes (Signed)
Electrophysiology TeleHealth Note   Due to national recommendations of social distancing due to COVID 19, an audio/video telehealth visit is felt to be most appropriate for this patient at this time.  See MyChart message from today for the patient's consent to telehealth for [facility].   Date:  06/12/2019   ID:  Wyvonnia Lora, DOB [birth year], MRN [id number]  Location: patient's home  Provider location:  [facility]  Evaluation Performed: Follow-up visit  PCP:  Prince Solian, MD   Electrophysiologist:  Dr Rayann Heman  Chief Complaint:  palpitations  History of Present Illness:    JOAS MOTTON is a 53 y.o. male who presents via telehealth conferencing today.  Since last being seen in our clinic, the patient reports doing very well.  He has occasional palpitations with his PVCs.  Snores.  His wife worries about sleep apnea.  Today, he denies symptoms of palpitations, chest pain,  lower extremity edema, dizziness, presyncope, or syncope.  The patient is otherwise without complaint today.  The patient denies symptoms of fevers, chills, cough, or new SOB worrisome for COVID 19.  Past Medical History:  Diagnosis Date  . Allergy to alpha-gal   . Hyperlipidemia    Borderline  . Premature ventricular contractions     Past Surgical History:  Procedure Laterality Date  . MENISCUS REPAIR     Left  . TONSILLECTOMY      No current outpatient medications on file.   No current facility-administered medications for this visit.     Allergies:   Patient has no known allergies.   Social History:  The patient  reports that he has never smoked. He has never used smokeless tobacco. He reports current alcohol use of about 3.0 standard drinks of alcohol per week. He reports previous drug use.   Family History:  The patient's  family history includes Congenital heart disease in his son; Hyperlipidemia in his mother; Hypertension in his father.   ROS:  Please see the history of  present illness.   All other systems are personally reviewed and negative.    Exam:    Vital Signs:  BP 123/68   Pulse 68   Ht 5\' 11"  (1.803 m)   Wt 195 lb (88.5 kg)   BMI 27.20 kg/m   Well sounding and appearing, alert and conversant, regular work of breathing,  good skin color Eyes- anicteric, neuro- grossly intact, skin- no apparent rash or lesions or cyanosis, mouth- oral mucosa is pink  Labs/Other Tests and Data Reviewed:    Recent Labs: No results found for requested labs within last 8760 hours.   Wt Readings from Last 3 Encounters:  06/12/19 195 lb (88.5 kg)  03/02/19 195 lb (88.5 kg)  02/27/19 190 lb (86.2 kg)     ASSESSMENT & PLAN:    1.  PVCs Minimally symptomatic EF preserved Holter, echo and MRI reviewed with him today. We discussed options of beta blocker, AAD therapy (flecainide) and ablation today.   He is clear that he would not wish to proceed with any of these options currently  2. HL On crestor  3. Snoring Order a sleep study  Follow-up:  Return to see EP PA in 6 months   Patient Risk:  after full review of this patients clinical status, I feel that they are at moderate risk at this time.  Today, I have spent 15 minutes with the patient with telehealth technology discussing arrhythmia management .    Signed, Thompson Grayer,  MD  06/12/2019 12:07 PM     [facility] [address] [place] [address] [phone] (office) [phone] (fax)

## 2019-06-15 ENCOUNTER — Telehealth: Payer: Self-pay

## 2019-06-15 NOTE — Telephone Encounter (Signed)
-----   Message from Thompson Grayer, MD sent at 06/12/2019 12:16 PM EDT ----- Sleep study  Dr Radford Pax

## 2019-06-17 NOTE — Telephone Encounter (Signed)
Outreach made to Pt.    Requested call back to discuss spit night study vs outpatient sleep study.  Advised to call back.

## 2019-10-02 ENCOUNTER — Ambulatory Visit: Payer: BC Managed Care – PPO | Attending: Internal Medicine

## 2019-10-02 ENCOUNTER — Other Ambulatory Visit: Payer: Self-pay

## 2019-10-02 DIAGNOSIS — Z20822 Contact with and (suspected) exposure to covid-19: Secondary | ICD-10-CM

## 2019-10-03 LAB — NOVEL CORONAVIRUS, NAA: SARS-CoV-2, NAA: NOT DETECTED

## 2019-10-19 DIAGNOSIS — D649 Anemia, unspecified: Secondary | ICD-10-CM | POA: Diagnosis not present

## 2019-10-19 DIAGNOSIS — R7982 Elevated C-reactive protein (CRP): Secondary | ICD-10-CM | POA: Diagnosis not present

## 2019-10-19 DIAGNOSIS — E559 Vitamin D deficiency, unspecified: Secondary | ICD-10-CM | POA: Diagnosis not present

## 2019-10-19 DIAGNOSIS — E7800 Pure hypercholesterolemia, unspecified: Secondary | ICD-10-CM | POA: Diagnosis not present

## 2019-10-19 DIAGNOSIS — N400 Enlarged prostate without lower urinary tract symptoms: Secondary | ICD-10-CM | POA: Diagnosis not present

## 2019-10-19 DIAGNOSIS — R7401 Elevation of levels of liver transaminase levels: Secondary | ICD-10-CM | POA: Diagnosis not present

## 2019-10-19 DIAGNOSIS — R7989 Other specified abnormal findings of blood chemistry: Secondary | ICD-10-CM | POA: Diagnosis not present

## 2019-10-19 DIAGNOSIS — R7309 Other abnormal glucose: Secondary | ICD-10-CM | POA: Diagnosis not present

## 2019-10-19 DIAGNOSIS — R748 Abnormal levels of other serum enzymes: Secondary | ICD-10-CM | POA: Diagnosis not present

## 2020-02-12 DIAGNOSIS — Z125 Encounter for screening for malignant neoplasm of prostate: Secondary | ICD-10-CM | POA: Diagnosis not present

## 2020-02-12 DIAGNOSIS — Z79899 Other long term (current) drug therapy: Secondary | ICD-10-CM | POA: Diagnosis not present

## 2020-02-12 DIAGNOSIS — E7849 Other hyperlipidemia: Secondary | ICD-10-CM | POA: Diagnosis not present

## 2020-02-17 DIAGNOSIS — E785 Hyperlipidemia, unspecified: Secondary | ICD-10-CM | POA: Diagnosis not present

## 2020-02-17 DIAGNOSIS — R002 Palpitations: Secondary | ICD-10-CM | POA: Diagnosis not present

## 2020-02-17 DIAGNOSIS — Z1331 Encounter for screening for depression: Secondary | ICD-10-CM | POA: Diagnosis not present

## 2020-02-17 DIAGNOSIS — Z0000 Encounter for general adult medical examination without abnormal findings: Secondary | ICD-10-CM | POA: Diagnosis not present

## 2020-02-17 DIAGNOSIS — R0683 Snoring: Secondary | ICD-10-CM | POA: Diagnosis not present

## 2020-02-17 DIAGNOSIS — T7800XS Anaphylactic reaction due to unspecified food, sequela: Secondary | ICD-10-CM | POA: Diagnosis not present

## 2020-08-30 DIAGNOSIS — D3132 Benign neoplasm of left choroid: Secondary | ICD-10-CM | POA: Diagnosis not present

## 2021-02-17 ENCOUNTER — Ambulatory Visit
Admission: RE | Admit: 2021-02-17 | Discharge: 2021-02-17 | Disposition: A | Discharge status: Home / Self Care | Payer: BC Managed Care – PPO | Source: Ambulatory Visit | Attending: Internal Medicine | Admitting: Internal Medicine

## 2021-02-17 ENCOUNTER — Other Ambulatory Visit: Payer: Self-pay | Admitting: Internal Medicine

## 2021-02-17 ENCOUNTER — Other Ambulatory Visit: Payer: Self-pay

## 2021-02-17 DIAGNOSIS — K3189 Other diseases of stomach and duodenum: Secondary | ICD-10-CM | POA: Diagnosis not present

## 2021-02-17 DIAGNOSIS — K5790 Diverticulosis of intestine, part unspecified, without perforation or abscess without bleeding: Secondary | ICD-10-CM | POA: Diagnosis not present

## 2021-02-17 DIAGNOSIS — K529 Noninfective gastroenteritis and colitis, unspecified: Secondary | ICD-10-CM | POA: Diagnosis not present

## 2021-02-17 DIAGNOSIS — R1030 Lower abdominal pain, unspecified: Secondary | ICD-10-CM

## 2021-02-17 DIAGNOSIS — R1032 Left lower quadrant pain: Secondary | ICD-10-CM | POA: Diagnosis not present

## 2021-02-17 DIAGNOSIS — E785 Hyperlipidemia, unspecified: Secondary | ICD-10-CM | POA: Diagnosis not present

## 2021-02-17 DIAGNOSIS — K5732 Diverticulitis of large intestine without perforation or abscess without bleeding: Secondary | ICD-10-CM | POA: Diagnosis not present

## 2021-02-17 MED ORDER — IOPAMIDOL (ISOVUE-300) INJECTION 61%
100.0000 mL | Freq: Once | INTRAVENOUS | Status: AC | PRN
  Administered 2021-02-17: 100 mL via INTRAVENOUS

## 2021-03-30 ENCOUNTER — Ambulatory Visit: Payer: BC Managed Care – PPO | Admitting: Gastroenterology

## 2021-04-03 DIAGNOSIS — E785 Hyperlipidemia, unspecified: Secondary | ICD-10-CM | POA: Diagnosis not present

## 2021-04-03 DIAGNOSIS — Z125 Encounter for screening for malignant neoplasm of prostate: Secondary | ICD-10-CM | POA: Diagnosis not present

## 2021-05-03 DIAGNOSIS — Z0000 Encounter for general adult medical examination without abnormal findings: Secondary | ICD-10-CM | POA: Diagnosis not present

## 2021-05-03 DIAGNOSIS — R002 Palpitations: Secondary | ICD-10-CM | POA: Diagnosis not present

## 2021-05-05 DIAGNOSIS — N281 Cyst of kidney, acquired: Secondary | ICD-10-CM | POA: Diagnosis not present

## 2021-07-19 DIAGNOSIS — R002 Palpitations: Secondary | ICD-10-CM | POA: Diagnosis not present

## 2021-07-25 ENCOUNTER — Encounter: Payer: Self-pay | Admitting: Neurology

## 2021-07-26 ENCOUNTER — Institutional Professional Consult (permissible substitution): Payer: BC Managed Care – PPO | Admitting: Neurology

## 2021-07-27 ENCOUNTER — Encounter: Payer: Self-pay | Admitting: Neurology

## 2021-08-08 DIAGNOSIS — Z23 Encounter for immunization: Secondary | ICD-10-CM | POA: Diagnosis not present

## 2022-05-09 DIAGNOSIS — L400 Psoriasis vulgaris: Secondary | ICD-10-CM | POA: Diagnosis not present

## 2022-05-09 DIAGNOSIS — L309 Dermatitis, unspecified: Secondary | ICD-10-CM | POA: Diagnosis not present

## 2022-05-09 DIAGNOSIS — R21 Rash and other nonspecific skin eruption: Secondary | ICD-10-CM | POA: Diagnosis not present

## 2022-05-23 DIAGNOSIS — N281 Cyst of kidney, acquired: Secondary | ICD-10-CM | POA: Diagnosis not present

## 2022-07-05 IMAGING — CT CT ABD-PELV W/ CM
1 of 3 series · 13 of 32 positions shown, 18 images · IV contrast (APPLIED)
Comparison: None.

CLINICAL DATA: Possible diverticulitis, diarrhea which began 3
weeks ago with pain for 1-2 weeks

EXAM:
CT ABDOMEN AND PELVIS WITH CONTRAST
TECHNIQUE: Multidetector CT imaging of the abdomen and pelvis was performed
using the standard protocol following bolus administration of
intravenous contrast.
CONTRAST:  100mL EGDVZL-L88 IOPAMIDOL (EGDVZL-L88) INJECTION 61%

[Series 2: abd/pelvis w/cm · axial · 0.78mm/px · z∈[-503,-83]mm · 13 of 98 slices shown, 18 images]
[im 7/98  soft-tissue]
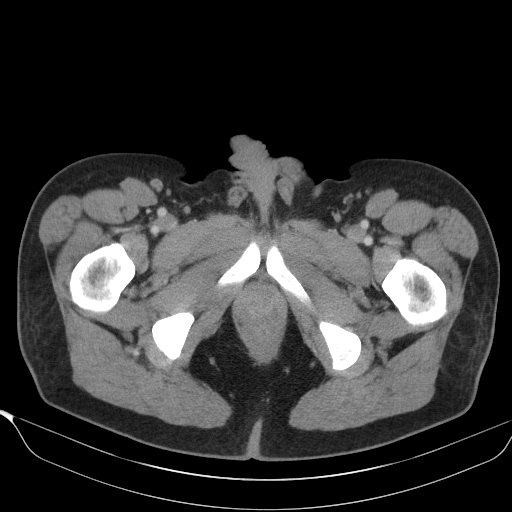
[im 7/98  bone]
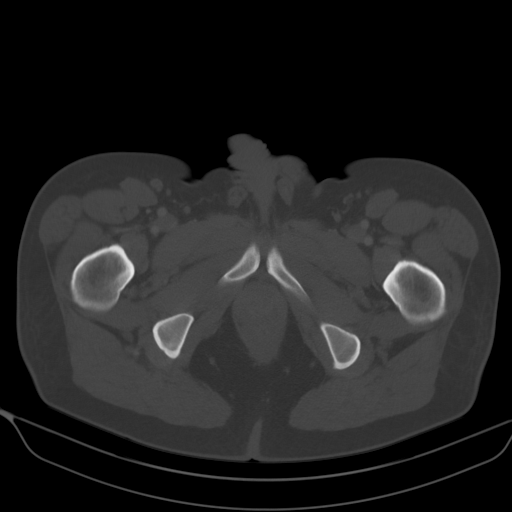
[im 13/98  soft-tissue]
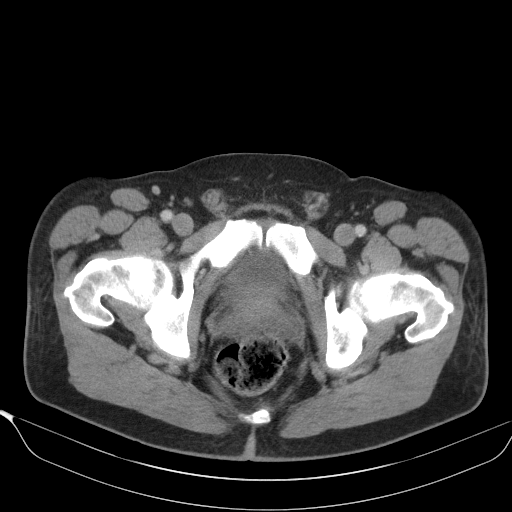
[im 20/98  soft-tissue]
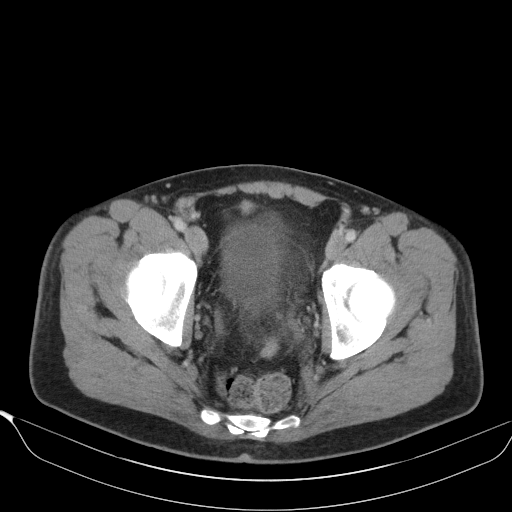
[im 33/98  soft-tissue]
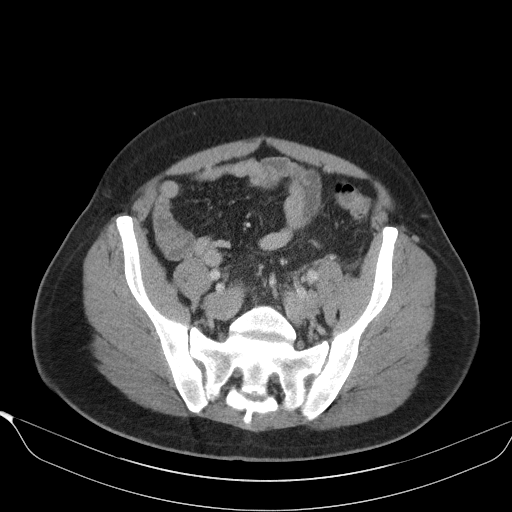
[im 39/98  soft-tissue]
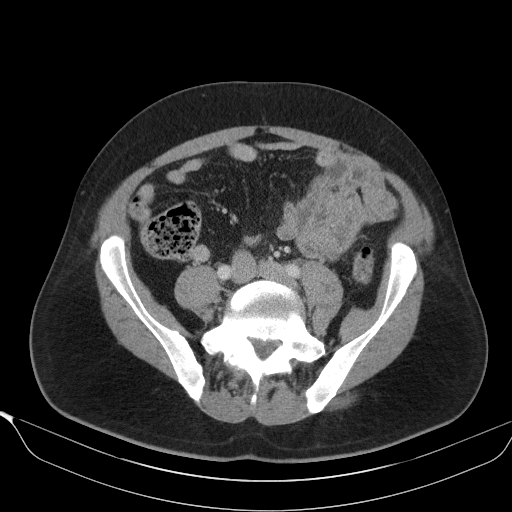
[im 46/98  soft-tissue]
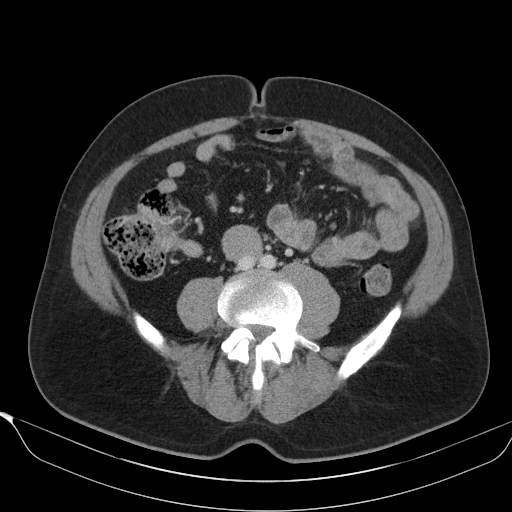
[im 52/98  soft-tissue]
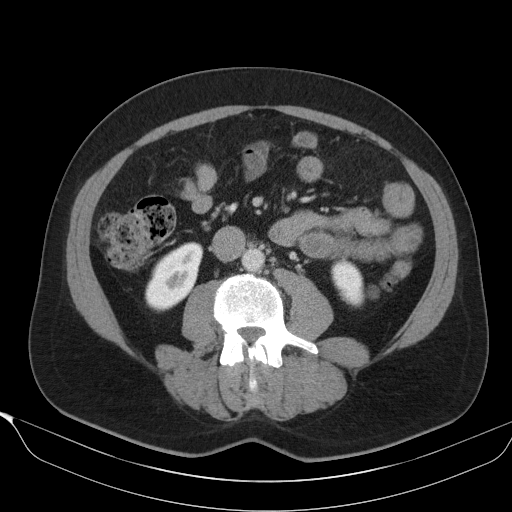
[im 59/98  soft-tissue]
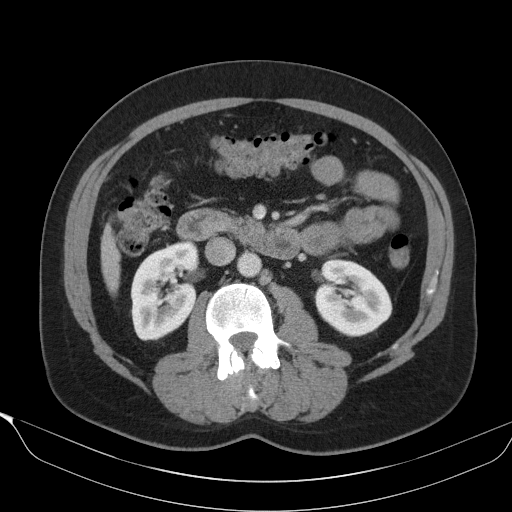
[im 65/98  soft-tissue]
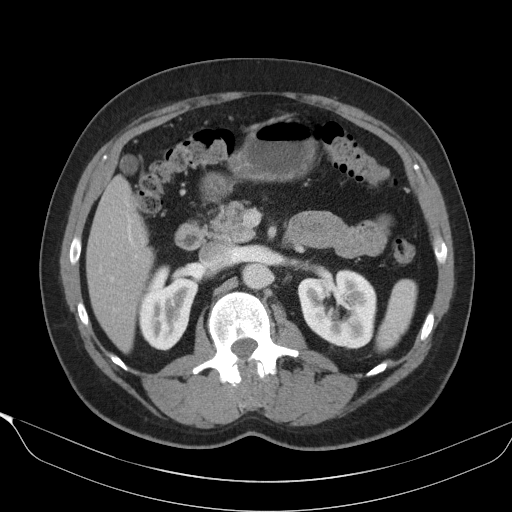
[im 65/98  bone]
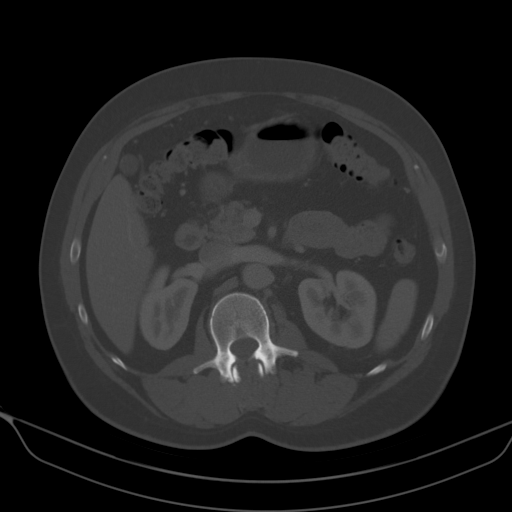
[im 72/98  lung]
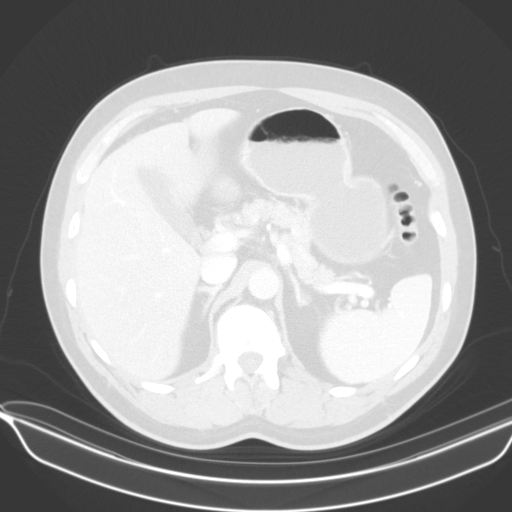
[im 78/98  soft-tissue]
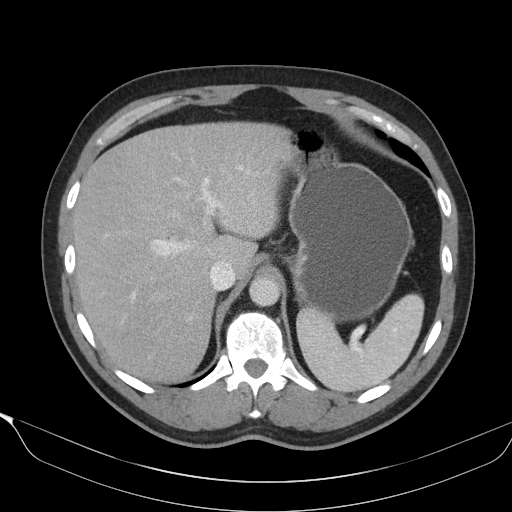
[im 78/98  lung]
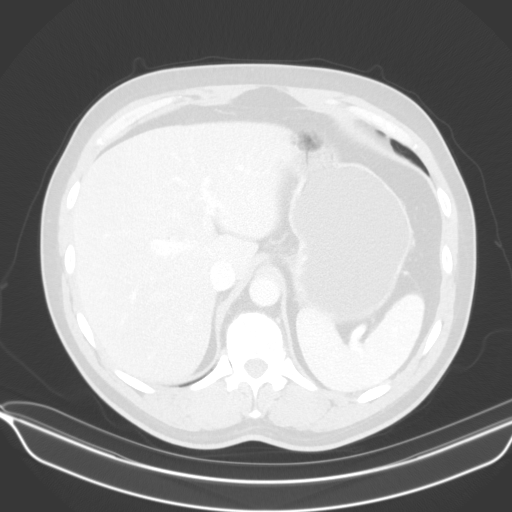
[im 85/98  soft-tissue]
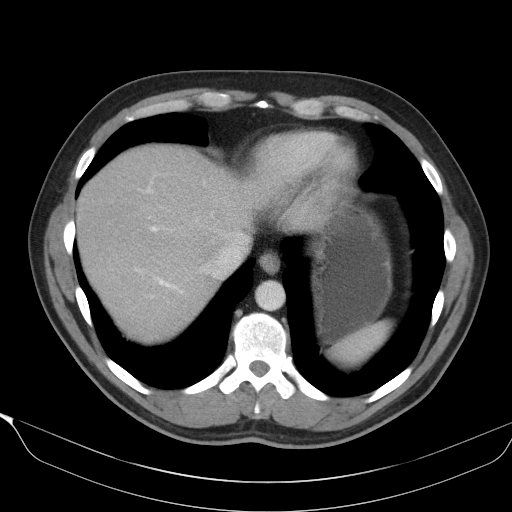
[im 85/98  lung]
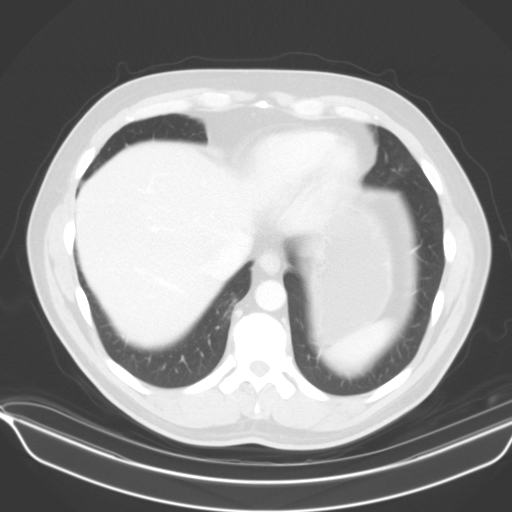
[im 91/98  soft-tissue]
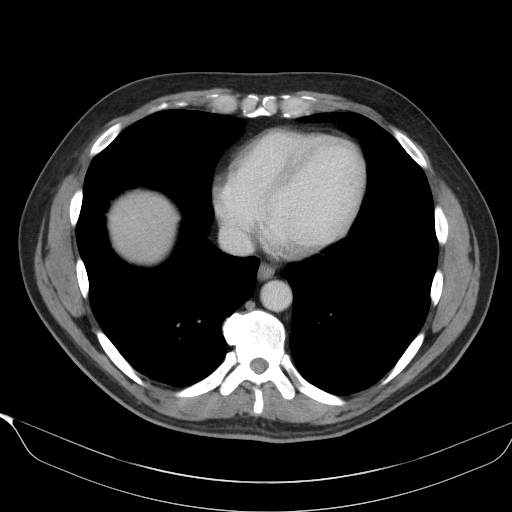
[im 91/98  lung]
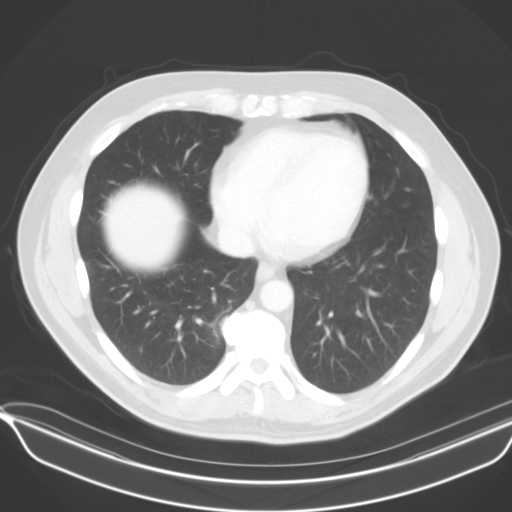

[13 of 32 positions shown; findings below may reference images not displayed]

FINDINGS: Lower chest: Mild bilateral gynecomastia. Few small chest wall lymph
nodes. Minimal atelectasis in the right lung base. Lung bases
otherwise clear. Normal heart size. No pericardial effusion.

Hepatobiliary: Diffuse hepatic hypoattenuation compatible with
hepatic steatosis. Sparing along the gallbladder fossa. No worrisome
focal liver abnormality is seen. Normal gallbladder. No visible
calcified gallstones. No biliary ductal dilatation.

Pancreas: No pancreatic ductal dilatation or surrounding
inflammatory changes.

Spleen: Normal in size. No concerning splenic lesions. Accessory
splenule towards the hilum.

Adrenals/Urinary Tract: Normal adrenal glands. Kidneys are normally
located with symmetric enhancement and excretion. Fluid attenuation
cyst with suggestion of few thin internal septa noted in the lower
pole left kidney measuring up to 1.6 cm in size (Bosniak 2). No
suspicious renal lesion, urolithiasis or hydronephrosis. Urinary
bladder is unremarkable for the degree of distention.

Stomach/Bowel: Stomach mildly distended with ingested fluid. No
small bowel thickening or dilatation. Normal duodenal sweep across
the midline abdomen. A normal appendix is visualized. No proximal
colonic thickening or dilatation. Segmental thickening of the distal
sigmoid with inflammation centered upon a culprit diverticulum
(2/74, 3/59, 4/66). No extraluminal gas, organized collection or
abscess is seen.

Vascular/Lymphatic: Minimal atherosclerotic calcifications within
the abdominal aorta and branch vessels. No aneurysm or ectasia. No
enlarged abdominopelvic lymph nodes. Few reactive appearing nodes in
the pelvis.

Reproductive: The prostate and seminal vesicles are unremarkable.

Other: No abdominopelvic free fluid or free gas. No bowel containing
hernias.

Musculoskeletal: Dextrocurvature of the thoracolumbar spine, apex
L1. Transitional anatomy. For numbering convention, lowest fully
formed disc space denoted as L5-S1. Rudimentary ribs at the lowest
thoracic level (presumed T12) and a transitional lumbosacral
vertebrae with sacralization of the bilateral L5 transverse
processes.
IMPRESSION: Features of acute uncomplicated diverticulitis centered in the
sigmoid colon. No evidence of frank perforation, abscess or other
organized collection.

## 2022-10-22 ENCOUNTER — Encounter (HOSPITAL_BASED_OUTPATIENT_CLINIC_OR_DEPARTMENT_OTHER): Payer: Self-pay | Admitting: Emergency Medicine

## 2022-10-22 ENCOUNTER — Other Ambulatory Visit: Payer: Self-pay

## 2022-10-22 ENCOUNTER — Emergency Department (HOSPITAL_BASED_OUTPATIENT_CLINIC_OR_DEPARTMENT_OTHER): Payer: BC Managed Care – PPO | Admitting: Radiology

## 2022-10-22 ENCOUNTER — Emergency Department (HOSPITAL_BASED_OUTPATIENT_CLINIC_OR_DEPARTMENT_OTHER)
Admission: EM | Admit: 2022-10-22 | Discharge: 2022-10-22 | Disposition: A | Discharge status: Home / Self Care | Payer: BC Managed Care – PPO | Attending: Emergency Medicine | Admitting: Emergency Medicine

## 2022-10-22 DIAGNOSIS — R079 Chest pain, unspecified: Secondary | ICD-10-CM | POA: Diagnosis not present

## 2022-10-22 DIAGNOSIS — R0789 Other chest pain: Secondary | ICD-10-CM | POA: Insufficient documentation

## 2022-10-22 LAB — TROPONIN I (HIGH SENSITIVITY)
Troponin I (High Sensitivity): 2 ng/L (ref <18)
Troponin I (High Sensitivity): 3 ng/L (ref <18)

## 2022-10-22 LAB — CBC
HCT: 49.2 % (ref 39.0–52.0)
Hemoglobin: 17.3 g/dL — ABNORMAL HIGH (ref 13.0–17.0)
MCH: 31.2 pg (ref 26.0–34.0)
MCHC: 35.2 g/dL (ref 30.0–36.0)
MCV: 88.8 fL (ref 80.0–100.0)
Platelets: 174 10*3/uL (ref 150–400)
RBC: 5.54 MIL/uL (ref 4.22–5.81)
RDW: 12.2 % (ref 11.5–15.5)
WBC: 8.1 10*3/uL (ref 4.0–10.5)
nRBC: 0 % (ref 0.0–0.2)

## 2022-10-22 LAB — BASIC METABOLIC PANEL
Anion gap: 9 (ref 5–15)
BUN: 16 mg/dL (ref 6–20)
CO2: 24 mmol/L (ref 22–32)
Calcium: 9.5 mg/dL (ref 8.9–10.3)
Chloride: 103 mmol/L (ref 98–111)
Creatinine, Ser: 0.78 mg/dL (ref 0.61–1.24)
GFR, Estimated: >60 mL/min (ref >60)
Glucose, Bld: 104 mg/dL — ABNORMAL HIGH (ref 70–99)
Potassium: 3.8 mmol/L (ref 3.5–5.1)
Sodium: 136 mmol/L (ref 135–145)

## 2022-10-22 LAB — D-DIMER, QUANTITATIVE: D-Dimer, Quant: 0.29 ug/mL-FEU (ref 0.00–0.50)

## 2022-10-22 MED ORDER — PANTOPRAZOLE SODIUM 20 MG PO TBEC: 20.0000 mg | DELAYED_RELEASE_TABLET | Freq: Every day | ORAL | 0 refills | Status: DC

## 2022-10-22 MED ORDER — ALUM & MAG HYDROXIDE-SIMETH 200-200-20 MG/5ML PO SUSP
30.0000 mL | Freq: Once | ORAL | Status: AC
  Administered 2022-10-22: 30 mL via ORAL
  Filled 2022-10-22: qty 30

## 2022-10-22 MED ORDER — SUCRALFATE 1 G PO TABS: 1.0000 g | ORAL_TABLET | Freq: Three times a day (TID) | ORAL | 0 refills | Status: DC

## 2022-10-22 NOTE — ED Notes (Signed)
ED Provider at bedside. 

## 2022-10-22 NOTE — ED Notes (Signed)
Pt agreeable with d/c plan as discussed by provider- this nurse has verbally reinforced d/c instructions and provided pt with written copy.  Pt acknowledges verbal understanding and denies any addl questions, concerns, needs- pt ambulatory independently at d/c with steady gait - vitals stable/no acute distress-changes

## 2022-10-22 NOTE — ED Provider Notes (Signed)
[facility] Provider Note   CSN: [id number] Arrival date & time: 10/22/22  1907     History  Chief Complaint  Patient presents with   Chest Pain    Nathan Calhoun is a 57 y.o. male.  Patient here with intermittent sharp chest pain last couple days.  Short brief episodes of chest discomfort that are not associated with anything.  He has a history of high cholesterol but no other major medical problems.  No cardiac history in the family.  No smoking history.  Denies any shortness of breath or chest pain currently.  Thinks may be acid reflux.  No recent surgery or travel.  No history of blood clots.  Denies being anxious.  The history is provided by the patient.       Home Medications Prior to Admission medications   Medication Sig Start Date End Date Taking? Authorizing Provider  pantoprazole (PROTONIX) 20 MG tablet Take 1 tablet (20 mg total) by mouth daily for 14 days. 10/22/22 11/05/22 Yes Renna Kilmer, DO  sucralfate (CARAFATE) 1 g tablet Take 1 tablet (1 g total) by mouth 4 (four) times daily -  with meals and at bedtime for 14 days. 10/22/22 11/05/22 Yes Makari Portman, DO  rosuvastatin (CRESTOR) 10 MG tablet Take 10 mg by mouth daily.    [provider]      Allergies    Alpha-d-galactosidase [tilactase]    Review of Systems   Review of Systems  Physical Exam Updated Vital Signs BP (!) 145/99   Pulse 65   Temp 98.4 F (36.9 C) (Oral)   Resp 16   SpO2 96%  Physical Exam Vitals and nursing note reviewed.  Constitutional:      General: He is not in acute distress.    Appearance: He is well-developed. He is not ill-appearing.  HENT:     Head: Normocephalic and atraumatic.  Eyes:     Extraocular Movements: Extraocular movements intact.     Conjunctiva/sclera: Conjunctivae normal.     Pupils: Pupils are equal, round, and reactive to light.  Cardiovascular:     Rate and Rhythm: Normal rate and regular rhythm.      Pulses:          Radial pulses are 2+ on the right side and 2+ on the left side.     Heart sounds: Normal heart sounds. No murmur heard. Pulmonary:     Effort: Pulmonary effort is normal. No respiratory distress.     Breath sounds: Normal breath sounds. No decreased breath sounds.  Abdominal:     Palpations: Abdomen is soft.     Tenderness: There is no abdominal tenderness.  Musculoskeletal:        General: No swelling.     Cervical back: Normal range of motion and neck supple.  Skin:    General: Skin is warm and dry.     Capillary Refill: Capillary refill takes less than 2 seconds.  Neurological:     General: No focal deficit present.     Mental Status: He is alert.  Psychiatric:        Mood and Affect: Mood normal.     ED Results / Procedures / Treatments   Labs (all labs ordered are listed, but only abnormal results are displayed) Labs Reviewed  BASIC METABOLIC PANEL - Abnormal; Notable for the following components:      Result Value   Glucose, Bld 104 (*)    All other components  within normal limits  CBC - Abnormal; Notable for the following components:   Hemoglobin 17.3 (*)    All other components within normal limits  D-DIMER, QUANTITATIVE  TROPONIN I (HIGH SENSITIVITY)  TROPONIN I (HIGH SENSITIVITY)    EKG EKG Interpretation  Date/Time:  Monday October 22 2022 19:20:26 EST Ventricular Rate:  73 PR Interval:  130 QRS Duration: 88 QT Interval:  366 QTC Calculation: 403 R Axis:   31 Text Interpretation: Normal sinus rhythm Normal ECG No previous ECGs available Confirmed by Lennice Sites ([id number]) on 10/22/2022 7:50:38 PM  Radiology DG Chest 2 View  Result Date: 10/22/2022 CLINICAL DATA:  Left-sided chest pain for 3 days EXAM: CHEST - 2 VIEW COMPARISON:  None Available. FINDINGS: The heart size and mediastinal contours are within normal limits. Both lungs are clear. The visualized skeletal structures are unremarkable. IMPRESSION: No acute abnormality of the  lungs. Electronically Signed   By: Delanna Ahmadi M.D.   On: 10/22/2022 20:09    Procedures Procedures    Medications Ordered in ED Medications  alum & mag hydroxide-simeth (MAALOX/MYLANTA) 200-200-20 MG/5ML suspension 30 mL (has no administration in time range)    ED Course/ Medical Decision Making/ A&P             HEART Score: 2                Medical Decision Making Amount and/or Complexity of Data Reviewed Labs: ordered. Radiology: ordered.  Risk OTC drugs. Prescription drug management.   Nathan Calhoun is here with chest pain.  Normal vitals.  No fever.  EKG in triage per my review and interpretation shows sinus rhythm.  No ischemic changes.  History of high cholesterol but no other significant medical changes.  He has been having short sharp chest discomfort intermittently the last few days.  Nothing makes it worse or better.  Overall atypical story for ACS but differential diagnosis includes ACS, less likely PE, pneumonia, electrolyte abnormality.  Could be acid reflux or MSK pain.  Will get CBC, BMP, troponin, D-dimer, chest x-ray.  Heart score is 2.  Per my review and interpretation of labs, D-dimer negative.  Doubt PE.  No significant anemia, electrolyte abnormality or kidney injury otherwise.  Gallbladder and liver enzymes within normal limits and doubt pancreatitis or cholecystitis.  Troponin negative x 2.  Chest x-ray per my review and interpretation shows no evidence of pneumonia or pneumothorax.  Overall we will have him follow-up with cardiology.  He understands return precautions.  Recommend bland diet and rest and hydration.  Recommend reflux medications.  Discharged in good condition.  This chart was dictated using voice recognition software.  Despite best efforts to proofread,  errors can occur which can change the documentation meaning.         Final Clinical Impression(s) / ED Diagnoses Final diagnoses:  Atypical chest pain    Rx / DC Orders ED  Discharge Orders          Ordered    pantoprazole (PROTONIX) 20 MG tablet  Daily        10/22/22 2212    sucralfate (CARAFATE) 1 g tablet  3 times daily with meals & bedtime        10/22/22 2212    Ambulatory referral to Cardiology       Comments: If you have not heard from the Cardiology office within the next 72 hours please call [phone].   10/22/22 2212  Virgina Norfolk, DO 10/22/22 2213

## 2022-10-22 NOTE — ED Triage Notes (Signed)
Left sided chest pain x 3 days worse today. Denies n/v,  Some sob Comes and goes about every 10 minutes

## 2022-10-24 ENCOUNTER — Ambulatory Visit: Payer: BC Managed Care – PPO | Attending: Internal Medicine | Admitting: Internal Medicine

## 2022-10-24 ENCOUNTER — Ambulatory Visit (INDEPENDENT_AMBULATORY_CARE_PROVIDER_SITE_OTHER): Payer: BC Managed Care – PPO

## 2022-10-24 VITALS — BP 124/80 | HR 64 | Ht 66.0 in | Wt 218.0 lb

## 2022-10-24 DIAGNOSIS — R002 Palpitations: Secondary | ICD-10-CM

## 2022-10-24 NOTE — Progress Notes (Signed)
Cardiology Office Note:    Date:  10/24/2022   ID:  Nathan Calhoun, DOB [birth year], MRN [id number]  PCP:  Prince Solian, MD   [place] Providers Cardiologist:  None     Referring MD: Lennice Sites, DO   Chief Complaint  Patient presents with   New Patient (Initial Visit)   Chest Pain    History of Present Illness:    Nathan Calhoun is a 57 y.o. male with a hx of HLD, PVCs referral from the ED with sharp chest pain. Non cardiac in nature.  He notes localized central sharp CP. Non radiating. Not associated with activity. Felt it in the AM  He still feels palpitations  Blood pressure is well controlled. On crestor 10 mg daily.  Saw Dr. Percival Spanish in 2012 for palpitations. Cardiac monitor showed PVC burden 7.8%. TTE showed normal LV function,  RV was normal. No valve disease. See a prescription for metoprolol succinate 50 mg daily, does not remember much about it, see from 2020  Past Medical History:  Diagnosis Date   Allergy to alpha-gal    Diverticulitis    Hyperlipidemia    Borderline   Premature ventricular contractions     Past Surgical History:  Procedure Laterality Date   MENISCUS REPAIR     Left   TONSILLECTOMY      Current Medications: Current Meds  Medication Sig   pantoprazole (PROTONIX) 20 MG tablet Take 1 tablet (20 mg total) by mouth daily for 14 days.   rosuvastatin (CRESTOR) 10 MG tablet Take 10 mg by mouth daily.   sucralfate (CARAFATE) 1 g tablet Take 1 tablet (1 g total) by mouth 4 (four) times daily -  with meals and at bedtime for 14 days.     Allergies:   Alpha-d-galactosidase [tilactase]   Social History   Socioeconomic History   Marital status: Married    Spouse name: Not on file   Number of children: 3   Years of education: Not on file   Highest education level: Not on file  Occupational History   Occupation: Roofer  Tobacco Use   Smoking status: Never   Smokeless tobacco: Never  Vaping Use   Vaping Use: Never used   Substance and Sexual Activity   Alcohol use: Not Currently    Alcohol/week: 3.0 standard drinks of alcohol    Types: 3 Cans of beer per week    Comment: 3-8 beers per week   Drug use: Not Currently   Sexual activity: Not on file  Other Topics Concern   Not on file  Social History Narrative   Lives in [place]   Owns a HVAC and roofing business.  Also owns and runs an Scientist, clinical (histocompatibility and immunogenetics).   Social Determinants of Health   Financial Resource Strain: Not on file  Food Insecurity: Not on file  Transportation Needs: Not on file  Physical Activity: Not on file  Stress: Not on file  Social Connections: Not on file     Family History: The patient's family history includes Congenital heart disease in his son; Hyperlipidemia in his mother; Hypertension in his father and mother. There is no history of Colon cancer, Colon polyps, Esophageal cancer, Rectal cancer, or Stomach cancer.  ROS:   Please see the history of present illness.     All other systems reviewed and are negative.  EKGs/Labs/Other Studies Reviewed:    The following studies were reviewed today:   EKG:  EKG is  ordered today.  The ekg ordered today demonstrates   10/24/2022-NSR  Recent Labs: 10/22/2022: BUN 16; Creatinine, Ser 0.78; Hemoglobin 17.3; Platelets 174; Potassium 3.8; Sodium 136   Recent Lipid Panel    Component Value Date/Time   CHOL 231 (H) 04/20/2011 0856   TRIG 67.0 04/20/2011 0856   HDL 52.40 04/20/2011 0856   CHOLHDL 4 04/20/2011 0856   VLDL 13.4 04/20/2011 0856   LDLDIRECT 157.0 04/20/2011 0856     Risk Assessment/Calculations:         Physical Exam:    VS:  Ht 5\' 6"  (1.676 m)   Wt 218 lb (98.9 kg)   BMI 35.19 kg/m     Wt Readings from Last 3 Encounters:  10/24/22 218 lb (98.9 kg)  06/12/19 195 lb (88.5 kg)  03/02/19 195 lb (88.5 kg)     GEN:  Well nourished, well developed in no acute distress HEENT: Normal NECK: No JVD; No carotid bruits LYMPHATICS: No  lymphadenopathy CARDIAC: RRR, no murmurs, rubs, gallops RESPIRATORY:  Clear to auscultation without rales, wheezing or rhonchi  ABDOMEN: Soft, non-tender, non-distended MUSCULOSKELETAL:  No edema; No deformity  SKIN: Warm and dry NEUROLOGIC:  Alert and oriented x 3 PSYCHIATRIC:  Normal affect   ASSESSMENT:    Non cardiac CP: notes in the AM, not associated with activity. C/f GERD. Continue pantoprazole.  Palpitations: no strong coffee. Had burden of PVCs in the past in 2012. Off BB. Will re-assess to determine if still has significant PVC burden. If he does, will restart BB and follow up.  PLAN:    In order of problems listed above:  3 day ziopatch Follow up pending results           Medication Adjustments/Labs and Tests Ordered: Current medicines are reviewed at length with the patient today.  Concerns regarding medicines are outlined above.  No orders of the defined types were placed in this encounter.  No orders of the defined types were placed in this encounter.   There are no Patient Instructions on file for this visit.   Signed, Janina Mayo, MD  10/24/2022 11:04 AM    [facility]

## 2022-10-24 NOTE — Progress Notes (Unsigned)
Enrolled patient for a 3 day Zio XT monitor to be mailed to patients home  

## 2022-10-24 NOTE — Patient Instructions (Signed)
Medication Instructions:  No changes *If you need a refill on your cardiac medications before your next appointment, please call your pharmacy*  Testing/Procedures: Your physician has recommended that you wear a 3 day DAY ZIO-PATCH monitor. The Zio patch cardiac monitor continuously records heart rhythm data for up to 14 days, this is for patients being evaluated for multiple types heart rhythms. For the first 24 hours post application, please avoid getting the Zio monitor wet in the shower or by excessive sweating during exercise. After that, feel free to carry on with regular activities. Keep soaps and lotions away from the ZIO XT Patch.  This will be mailed to you, please expect 7-10 days to receive.    Applying the monitor   Shave hair from upper left chest.   Hold abrader disc by orange tab.  Rub abrader in 40 strokes over left upper chest as indicated in your monitor instructions.   Clean area with 4 enclosed alcohol pads .  Use all pads to assure are is cleaned thoroughly.  Let dry.   Apply patch as indicated in monitor instructions.  Patch will be place under collarbone on left side of chest with arrow pointing upward.   Rub patch adhesive wings for 2 minutes.Remove white label marked "1".  Remove white label marked "2".  Rub patch adhesive wings for 2 additional minutes.   While looking in a mirror, press and release button in center of patch.  A small green light will flash 3-4 times .  This will be your only indicator the monitor has been turned on.     Do not shower for the first 24 hours.  You may shower after the first 24 hours.   Press button if you feel a symptom. You will hear a small click.  Record Date, Time and Symptom in the Patient Log Book.   When you are ready to remove patch, follow instructions on last 2 pages of Patient Log Book.  Stick patch monitor onto last page of Patient Log Book.   Place Patient Log Book in [facility] box.  Use locking tab on box and tape box  closed securely.  The [employer] and [employer] has [employer] on it.  Please place in mailbox as soon as possible.  Your physician should have your test results approximately 7 days after the monitor has been mailed back to [facility].   Call [facility] at [phone] if you have questions regarding your ZIO XT patch monitor.  Call them immediately if you see an orange light blinking on your monitor.   If your monitor falls off in less than 4 days contact our Monitor department at [phone].  If your monitor becomes loose or falls off after 4 days call [employer] at [phone] for suggestions on securing your monitor    Follow-Up: At [facility], you and your health needs are our priority.  As part of our continuing mission to provide you with exceptional heart care, we have created designated Provider Care Teams.  These Care Teams include your primary Cardiologist (physician) and Advanced Practice Providers (APPs -  Physician Assistants and Nurse Practitioners) who all work together to provide you with the care you need, when you need it.  We recommend signing up for the patient portal called "MyChart".  Sign up information is provided on this After Visit Summary.  MyChart is used to connect with patients for Virtual Visits (Telemedicine).  Patients are able to view lab/test results, encounter notes,  upcoming appointments, etc.  Non-urgent messages can be sent to your provider as well.   To learn more about what you can do with MyChart, go to [email or web address].    Your next appointment:   Follow up as needed   Provider:   Dr Harl Bowie

## 2022-10-26 DIAGNOSIS — E785 Hyperlipidemia, unspecified: Secondary | ICD-10-CM | POA: Diagnosis not present

## 2022-10-26 DIAGNOSIS — J309 Allergic rhinitis, unspecified: Secondary | ICD-10-CM | POA: Diagnosis not present

## 2022-10-26 DIAGNOSIS — Z125 Encounter for screening for malignant neoplasm of prostate: Secondary | ICD-10-CM | POA: Diagnosis not present

## 2022-10-26 DIAGNOSIS — R21 Rash and other nonspecific skin eruption: Secondary | ICD-10-CM | POA: Diagnosis not present

## 2022-10-31 DIAGNOSIS — R002 Palpitations: Secondary | ICD-10-CM | POA: Diagnosis not present

## 2022-10-31 DIAGNOSIS — R82998 Other abnormal findings in urine: Secondary | ICD-10-CM | POA: Diagnosis not present

## 2022-10-31 DIAGNOSIS — R0789 Other chest pain: Secondary | ICD-10-CM | POA: Diagnosis not present

## 2022-10-31 DIAGNOSIS — Z0000 Encounter for general adult medical examination without abnormal findings: Secondary | ICD-10-CM | POA: Diagnosis not present

## 2022-11-08 DIAGNOSIS — R002 Palpitations: Secondary | ICD-10-CM | POA: Diagnosis not present

## 2022-12-03 ENCOUNTER — Ambulatory Visit: Payer: BC Managed Care – PPO | Admitting: Neurology

## 2022-12-03 ENCOUNTER — Encounter: Payer: Self-pay | Admitting: Neurology

## 2022-12-03 VITALS — BP 127/83 | HR 70 | Ht 70.0 in | Wt 219.0 lb

## 2022-12-03 DIAGNOSIS — R351 Nocturia: Secondary | ICD-10-CM

## 2022-12-03 DIAGNOSIS — R0683 Snoring: Secondary | ICD-10-CM

## 2022-12-03 DIAGNOSIS — E669 Obesity, unspecified: Secondary | ICD-10-CM | POA: Diagnosis not present

## 2022-12-03 DIAGNOSIS — Z9189 Other specified personal risk factors, not elsewhere classified: Secondary | ICD-10-CM

## 2022-12-03 DIAGNOSIS — R0681 Apnea, not elsewhere classified: Secondary | ICD-10-CM | POA: Diagnosis not present

## 2022-12-03 DIAGNOSIS — D751 Secondary polycythemia: Secondary | ICD-10-CM

## 2022-12-03 NOTE — Patient Instructions (Signed)

## 2022-12-03 NOTE — Progress Notes (Addendum)
Subjective:    Patient ID: Nathan Calhoun is a 57 y.o. male.  HPI    Star Age, MD, PhD [facility] [address] [place], [place] [address]  Dear Dr. Dagmar Hait,  I saw your patient, Nathan Calhoun, upon your kind request in my sleep clinic today for initial consultation of his sleep disorder, in particular, concern for underlying obstructive sleep apnea.  The patient is unaccompanied today.  As you know, Mr. Pink is a 57 year old male with an underlying medical history of alpha gal, diverticulitis, hyperlipidemia, PVCs, erythrocytosis, and mild obesity, who reports snoring and excessive daytime somnolence as well as witnessed apneas per wife's report.  His Epworth sleepiness score is 6 out of 24, fatigue severity score is 25 out of 63.  I reviewed your office note from 10/31/2022.  CBC from 10/26/2022 showed a hemoglobin of 17.3, hematocrit 48.3.  He has had a hemoglobin above 18 and hematocrit above 50 before.  He is not aware of any family history of sleep apnea.  He had a tonsillectomy at age 27 or 28.  His wife has noticed gasping sounds and pauses in his breathing to the point where she notches him at night.  He does not wake up from this.  He does not have any recurrent nocturnal or morning headaches, has nocturia once in the early morning hours, close to when it is time to get up.  Bedtime is generally around 10:30 PM or 11 PM and rise time around 5 or 5:30 AM.  He is self-employed, works with Environmental education officer.  He lives with his wife, they have 3 grown children, they have 2 dogs in the household, the dogs do not sleep in the bedroom with them.  He does not have a TV in the bedroom.  He does not smoke, he drinks alcohol in the form of beer, 10 to 12/week, he drinks caffeine in the form of coffee, about 2 cups in the mornings.  Weight has been more or less stable but has had some weight gain in the past year.  He would be interested in an oral appliance.  His  Past Medical History Is Significant For: Past Medical History:  Diagnosis Date   Allergy to alpha-gal    Diverticulitis    Hyperlipidemia    Borderline   Premature ventricular contractions     His Past Surgical History Is Significant For: Past Surgical History:  Procedure Laterality Date   MENISCUS REPAIR     Left   TONSILLECTOMY      His Family History Is Significant For: Family History  Problem Relation Age of Onset   Hypertension Mother    Hyperlipidemia Mother    Hypertension Father    Congenital heart disease Son        TGV   Colon cancer Neg Hx    Colon polyps Neg Hx    Esophageal cancer Neg Hx    Rectal cancer Neg Hx    Stomach cancer Neg Hx    Sleep apnea Neg Hx     His Social History Is Significant For: Social History   Socioeconomic History   Marital status: Married    Spouse name: Not on file   Number of children: 3   Years of education: Not on file   Highest education level: Not on file  Occupational History   Occupation: Roofer  Tobacco Use   Smoking status: Never   Smokeless tobacco: Never  Vaping Use  Vaping Use: Never used  Substance and Sexual Activity   Alcohol use: Not Currently    Alcohol/week: 12.0 standard drinks of alcohol    Types: 12 Glasses of wine per week    Comment: 3-8 beers per week   Drug use: Not Currently   Sexual activity: Not on file  Other Topics Concern   Not on file  Social History Narrative   Lives in [place]   Owns a HVAC and roofing business.  Also owns and runs an Scientist, clinical (histocompatibility and immunogenetics).   Social Determinants of Health   Financial Resource Strain: Not on file  Food Insecurity: Not on file  Transportation Needs: Not on file  Physical Activity: Not on file  Stress: Not on file  Social Connections: Not on file    His Allergies Are:  Allergies  Allergen Reactions   Alpha-D-Galactosidase [Tilactase] Anaphylaxis, Diarrhea, Hives, Itching, Nausea And Vomiting, Rash, Shortness Of Breath and Swelling  :    His Current Medications Are:  Outpatient Encounter Medications as of 12/03/2022  Medication Sig   clobetasol cream (TEMOVATE) [id number] % 1 application Externally Twice a day for 30 days   nystatin-triamcinolone (MYCOLOG II) cream 1 application Externally Twice a day to feet   pantoprazole (PROTONIX) 20 MG tablet 1 tablet Orally Once a day for 90 days   rosuvastatin (CRESTOR) 10 MG tablet Take 10 mg by mouth daily.   pantoprazole (PROTONIX) 20 MG tablet Take 1 tablet (20 mg total) by mouth daily for 14 days.   sucralfate (CARAFATE) 1 g tablet Take 1 tablet (1 g total) by mouth 4 (four) times daily -  with meals and at bedtime for 14 days.   No facility-administered encounter medications on file as of 12/03/2022.  :   Review of Systems:  Out of a complete 14 point review of systems, all are reviewed and negative with the exception of these symptoms as listed below:  Review of Systems  Neurological:        Pt here for sleep consult Pt snores,Pt states wife says patient tries to swallow tongue while sleeping Pt denies hypertension,fatigue.headache,sleep study, CPAP machine    ESS:6 FSS:25     Objective:  Neurological Exam  Physical Exam Physical Examination:   Vitals:   12/03/22 1339  BP: 127/83  Pulse: 70    General Examination: The patient is a very pleasant 57 y.o. male in no acute distress. He appears well-developed and well-nourished and well groomed.   HEENT: Normocephalic, atraumatic, pupils are equal, round and reactive to light, extraocular tracking is good without limitation to gaze excursion or nystagmus noted. Hearing is grossly intact. Face is symmetric with normal facial animation. Speech is clear with no dysarthria noted. There is no hypophonia. There is no lip, neck/head, jaw or voice tremor. Neck is supple with full range of passive and active motion. There are no carotid bruits on auscultation. Oropharynx exam reveals: mild mouth dryness, adequate dental hygiene  and moderate airway crowding, due to redundant soft palate and larger uvula.  Mallampati class III, tonsils absent.  Tongue protrudes centrally and palate elevates symmetrically, neck circumference 16-1/2 inches, minimal overbite noted.  Chest: Clear to auscultation without wheezing, rhonchi or crackles noted.  Heart: S1+S2+0, regular and normal without murmurs, rubs or gallops noted.   Abdomen: Soft, non-tender and non-distended.  Extremities: There is no pitting edema in the distal lower extremities bilaterally.   Skin: Warm and dry without trophic changes noted.   Musculoskeletal: exam reveals no obvious joint  deformities.   Neurologically:  Mental status: The patient is awake, alert and oriented in all 4 spheres. His immediate and remote memory, attention, language skills and fund of knowledge are appropriate. There is no evidence of aphasia, agnosia, apraxia or anomia. Speech is clear with normal prosody and enunciation. Thought process is linear. Mood is normal and affect is normal.  Cranial nerves II - XII are as described above under HEENT exam.  Motor exam: Normal bulk, strength and tone is noted. There is no obvious action or resting tremor.  Fine motor skills and coordination: grossly intact.  Cerebellar testing: No dysmetria or intention tremor. There is no truncal or gait ataxia.  Sensory exam: intact to light touch in the upper and lower extremities.  Gait, station and balance: He stands easily. No veering to one side is noted. No leaning to one side is noted. Posture is age-appropriate and stance is narrow based. Gait shows normal stride length and normal pace. No problems turning are noted.   Assessment and Plan:   In summary, Myan A Whitesell is a very pleasant 57 y.o.-year old male with an underlying medical history of alpha gal, diverticulitis, hyperlipidemia, PVCs, erythrocytosis, and mild obesity, whose history and physical exam are concerning for sleep disordered  breathing, particularly obstructive sleep apnea (OSA). A laboratory attended sleep study is typically considered "gold standard" for evaluation of sleep disordered breathing.   I had a long chat with the patient about my findings and the diagnosis of sleep apnea, particularly OSA, its prognosis and treatment options. We talked about medical/conservative treatments, surgical interventions and non-pharmacological approaches for symptom control. I explained, in particular, the risks and ramifications of untreated moderate to severe OSA, especially with respect to developing cardiovascular disease down the road, including congestive heart failure (CHF), difficult to treat hypertension, cardiac arrhythmias (particularly A-fib), neurovascular complications including TIA, stroke and dementia. Even type 2 diabetes has, in part, been linked to untreated OSA. Symptoms of untreated OSA may include (but may not be limited to) daytime sleepiness, nocturia (i.e. frequent nighttime urination), memory problems, mood irritability and suboptimally controlled or worsening mood disorder such as depression and/or anxiety, lack of energy, lack of motivation, physical discomfort, as well as recurrent headaches, especially morning or nocturnal headaches. We talked about the importance of maintaining a healthy lifestyle and striving for healthy weight. In addition, we talked about the importance of striving for and maintaining good sleep hygiene. I recommended a sleep study at this time. I outlined the differences between a laboratory attended sleep study which is considered more comprehensive and accurate over the option of a home sleep test (HST); the latter may lead to underestimation of sleep disordered breathing in some instances and does not help with diagnosing upper airway resistance syndrome and is not accurate enough to diagnose primary central sleep apnea typically. I outlined possible surgical and non-surgical treatment  options of OSA, including the use of a positive airway pressure (PAP) device (i.e. CPAP, AutoPAP/APAP or BiPAP in certain circumstances), a custom-made dental device (aka oral appliance, which would require a referral to a specialist dentist or orthodontist typically, and is generally speaking not considered for patients with full dentures or edentulous state), upper airway surgical options, such as traditional UPPP (which is not considered a first-line treatment) or the Inspire device (hypoglossal nerve stimulator, which would involve a referral for consultation with an ENT surgeon, after careful selection, following inclusion criteria - also not first-line treatment). I explained the PAP treatment option to the  patient in detail, as this is generally considered first-line treatment.  The patient indicated that he would be willing to try PAP therapy, if the need arises. We will pick up our discussion about the next steps and treatment options after testing.  We will keep him posted as to the test results by phone call and/or MyChart messaging where possible.  We will plan to follow-up in sleep clinic accordingly as well.  I answered all his questions today and the patient was in agreement.   I encouraged him to call with any interim questions, concerns, problems or updates or email [place] through [place].  Generally speaking, sleep test authorizations may take up to 2 weeks, sometimes less, sometimes longer, the patient is encouraged to get in touch with [place] if they do not hear back from the sleep lab staff directly within the next 2 weeks.  Thank you very much for allowing me to participate in the care of this nice patient. If I can be of any further assistance to you please do not hesitate to call me at [phone].  Sincerely,   Star Age, MD, PhD  Addendum, 12/24/2022: HST ordered.

## 2022-12-24 ENCOUNTER — Telehealth: Payer: Self-pay | Admitting: Neurology

## 2022-12-24 NOTE — Telephone Encounter (Signed)
HST ordered as addendum to office note.

## 2022-12-24 NOTE — Telephone Encounter (Signed)
BCBS denied the NPSG- would you want to do a HST?

## 2022-12-24 NOTE — Addendum Note (Signed)
Addended by: Star Age on: 12/24/2022 10:42 AM   Modules accepted: Orders

## 2022-12-24 NOTE — Telephone Encounter (Signed)
Noted, thank you.  BCBS Josem KaufmannJN:[id number] (exp. 12/24/22 to 02/21/23)

## 2022-12-26 NOTE — Telephone Encounter (Signed)
12/25/22 left VM [place]  12/24/22 BCBS auth: [id number] (exp. 12/24/22 to 02/21/23) EE

## 2023-03-04 DIAGNOSIS — R7989 Other specified abnormal findings of blood chemistry: Secondary | ICD-10-CM | POA: Diagnosis not present

## 2023-03-04 DIAGNOSIS — R002 Palpitations: Secondary | ICD-10-CM | POA: Diagnosis not present

## 2023-03-04 DIAGNOSIS — E785 Hyperlipidemia, unspecified: Secondary | ICD-10-CM | POA: Diagnosis not present

## 2023-03-06 DIAGNOSIS — E7801 Familial hypercholesterolemia: Secondary | ICD-10-CM | POA: Diagnosis not present

## 2023-10-30 DIAGNOSIS — Z1212 Encounter for screening for malignant neoplasm of rectum: Secondary | ICD-10-CM | POA: Diagnosis not present

## 2023-10-30 DIAGNOSIS — Z125 Encounter for screening for malignant neoplasm of prostate: Secondary | ICD-10-CM | POA: Diagnosis not present

## 2023-10-30 DIAGNOSIS — E785 Hyperlipidemia, unspecified: Secondary | ICD-10-CM | POA: Diagnosis not present

## 2023-11-06 DIAGNOSIS — E7801 Familial hypercholesterolemia: Secondary | ICD-10-CM | POA: Diagnosis not present

## 2023-11-06 DIAGNOSIS — Z0000 Encounter for general adult medical examination without abnormal findings: Secondary | ICD-10-CM | POA: Diagnosis not present

## 2023-11-06 DIAGNOSIS — Z1331 Encounter for screening for depression: Secondary | ICD-10-CM | POA: Diagnosis not present

## 2023-11-06 DIAGNOSIS — R82998 Other abnormal findings in urine: Secondary | ICD-10-CM | POA: Diagnosis not present

## 2023-11-06 DIAGNOSIS — Z1339 Encounter for screening examination for other mental health and behavioral disorders: Secondary | ICD-10-CM | POA: Diagnosis not present

## 2023-11-20 DIAGNOSIS — Z01818 Encounter for other preprocedural examination: Secondary | ICD-10-CM | POA: Diagnosis not present

## 2023-12-04 DIAGNOSIS — K635 Polyp of colon: Secondary | ICD-10-CM | POA: Diagnosis not present

## 2023-12-04 DIAGNOSIS — Z1211 Encounter for screening for malignant neoplasm of colon: Secondary | ICD-10-CM | POA: Diagnosis not present

## 2023-12-04 DIAGNOSIS — K5730 Diverticulosis of large intestine without perforation or abscess without bleeding: Secondary | ICD-10-CM | POA: Diagnosis not present

## 2024-03-01 ENCOUNTER — Other Ambulatory Visit: Payer: Self-pay

## 2024-03-01 ENCOUNTER — Emergency Department (HOSPITAL_BASED_OUTPATIENT_CLINIC_OR_DEPARTMENT_OTHER): Payer: Self-pay

## 2024-03-01 ENCOUNTER — Emergency Department (HOSPITAL_BASED_OUTPATIENT_CLINIC_OR_DEPARTMENT_OTHER)
Admission: EM | Admit: 2024-03-01 | Discharge: 2024-03-01 | Disposition: A | Discharge status: Home / Self Care | Payer: Self-pay | Attending: Emergency Medicine | Admitting: Emergency Medicine

## 2024-03-01 DIAGNOSIS — I82451 Acute embolism and thrombosis of right peroneal vein: Secondary | ICD-10-CM | POA: Diagnosis not present

## 2024-03-01 DIAGNOSIS — Z7901 Long term (current) use of anticoagulants: Secondary | ICD-10-CM | POA: Insufficient documentation

## 2024-03-01 DIAGNOSIS — I82441 Acute embolism and thrombosis of right tibial vein: Secondary | ICD-10-CM | POA: Insufficient documentation

## 2024-03-01 DIAGNOSIS — I82401 Acute embolism and thrombosis of unspecified deep veins of right lower extremity: Secondary | ICD-10-CM | POA: Diagnosis not present

## 2024-03-01 MED ORDER — APIXABAN 5 MG PO TABS: ORAL_TABLET | ORAL | 0 refills | Status: AC

## 2024-03-01 NOTE — Discharge Instructions (Addendum)
 You were found to have a blood clot in 2 of the veins in your right leg (posterior tibial and peroneal veins).   You are being started on a blood thinner called Eliquis to help treat your blood clot.  Please take this as prescribed.  I placed a referral to our DVT clinic, they should be reaching out to you to schedule an appointment for follow up. If you have not heard from them within the next 3 days, please call the vascular doctor listed below for follow up within the next week (Dr. Susi Eric).  Return to the ER for any chest pain, shortness of breath, increasing pain or swelling in your leg, numb or cold foot, any other new or concerning symptoms

## 2024-03-01 NOTE — ED Triage Notes (Signed)
 Right lower leg soreness- ankle and calf. Ankle through calf appears swollen and warm compared to left leg. No thinners.

## 2024-03-01 NOTE — ED Notes (Signed)
 Pt d/c instructions, medications, and follow-up care reviewed with pt, including blood thinner information and education. Pt verbalized understanding and had no further questions at time of d/c. Pt CA&Ox4, ambulatory, and in NAD at time off d/c

## 2024-03-01 NOTE — ED Provider Notes (Signed)
 [place] [facility] AT [facility] Provider Note   CSN: [id number] Arrival date & time: 03/01/24  1331     History  Chief Complaint  Patient presents with   Leg Swelling    Nathan Calhoun is a 58 y.o. male with history of hyperlipidemia presents with concern for right calf and ankle pain and swelling that began overnight.  He denies any injury to his right lower extremity.  States he was sitting in his recliner for an extended period of time before this pain started.  He denies any long plane or car rides recently.  Denies any recent surgeries or hospitalizations.  Denies any chest pain or shortness of breath.  He notes he has had eczema on his lower extremity bilaterally for some time, no new skin changes or redness.  HPI     Home Medications Prior to Admission medications   Medication Sig Start Date End Date Taking? Authorizing Provider  apixaban (ELIQUIS) 5 MG TABS tablet Take 2 tablets (10mg ) twice daily for 7 days, then 1 tablet (5mg ) twice daily 03/01/24  Yes Rexie Catena, PA-C  clobetasol cream (TEMOVATE) 0.05 % 1 application Externally Twice a day for 30 days    [provider]  nystatin-triamcinolone (MYCOLOG II) cream 1 application Externally Twice a day to feet    [provider]  pantoprazole  (PROTONIX ) 20 MG tablet Take 1 tablet (20 mg total) by mouth daily for 14 days. 10/22/22 11/05/22  Curatolo, Adam, DO  pantoprazole  (PROTONIX ) 20 MG tablet 1 tablet Orally Once a day for 90 days    [provider]  rosuvastatin (CRESTOR) 10 MG tablet Take 10 mg by mouth daily.    [provider]  sucralfate  (CARAFATE ) 1 g tablet Take 1 tablet (1 g total) by mouth 4 (four) times daily -  with meals and at bedtime for 14 days. 10/22/22 11/05/22  Lowery Rue, DO      Allergies    Alpha-d-galactosidase [tilactase]    Review of Systems   Review of Systems  Musculoskeletal:        Right calf pain    Physical Exam Updated  Vital Signs BP (!) 147/92   Pulse 63   Temp 97.6 F (36.4 C) (Oral)   Resp 18   SpO2 94%  Physical Exam Vitals and nursing note reviewed.  Constitutional:      Appearance: Normal appearance.  HENT:     Head: Atraumatic.  Cardiovascular:     Rate and Rhythm: Normal rate and regular rhythm.     Comments: 2+ pedal pulses bilaterally Pulmonary:     Effort: Pulmonary effort is normal.     Breath sounds: Normal breath sounds.  Musculoskeletal:     Comments: Mild tenderness to palpation of the right calf and right medial ankle. Mild non-pitting edema of the right calf and ankle  Able to fully flex and extend at the right knee, full plantarflexion dorsiflexion of the right ankle  Skin:    Comments: Eczematous skin changes of the bilateral lower extremities.  No diffuse erythema of the right or left lower extremity.  No open wounds.  Neurological:     General: No focal deficit present.     Mental Status: He is alert.  Psychiatric:        Mood and Affect: Mood normal.        Behavior: Behavior normal.     ED Results / Procedures / Treatments   Labs (all labs ordered are listed, but  only abnormal results are displayed) Labs Reviewed - No data to display  EKG None  Radiology US  Venous Img Lower Right (DVT Study) Result Date: 03/01/2024 CLINICAL DATA:  Right calf and ankle pain and swelling for the past day EXAM: RIGHT LOWER EXTREMITY VENOUS DOPPLER ULTRASOUND TECHNIQUE: Gray-scale sonography with graded compression, as well as color Doppler and duplex ultrasound were performed to evaluate the lower extremity deep venous systems from the level of the common femoral vein and including the common femoral, femoral, profunda femoral, popliteal and calf veins including the posterior tibial, peroneal and gastrocnemius veins when visible. The superficial great saphenous vein was also interrogated. Spectral Doppler was utilized to evaluate flow at rest and with distal augmentation maneuvers in  the common femoral, femoral and popliteal veins. COMPARISON:  None Available. FINDINGS: Contralateral Common Femoral Vein: Respiratory phasicity is normal and symmetric with the symptomatic side. No evidence of thrombus. Normal compressibility. Common Femoral Vein: No evidence of thrombus. Normal compressibility, respiratory phasicity and response to augmentation. Saphenofemoral Junction: No evidence of thrombus. Normal compressibility and flow on color Doppler imaging. Profunda Femoral Vein: No evidence of thrombus. Normal compressibility and flow on color Doppler imaging. Femoral Vein: No evidence of thrombus. Normal compressibility, respiratory phasicity and response to augmentation. Popliteal Vein: No evidence of thrombus. Normal compressibility, respiratory phasicity and response to augmentation. Calf Veins: The posterior tibial and peroneal veins are thrombosed. The vessels are not compressible in their expanded in the lumen is filled with low-level internal echoes. No color flow visualized on color Doppler imaging. Superficial Great Saphenous Vein: No evidence of thrombus. Normal compressibility. Venous Reflux:  None. Other Findings:  None. IMPRESSION: Positive for acute occlusive calf vein DVT involving the posterior tibial and peroneal veins. Electronically Signed   By: Fernando Hoyer M.D.   On: 03/01/2024 15:59    Procedures Procedures    Medications Ordered in ED Medications - No data to display  ED Course/ Medical Decision Making/ A&P                                 Medical Decision Making    Differential diagnosis includes but is not limited to DVT, musculoskeletal pain, ankle sprain  ED Course:  Upon initial evaluation, patient is well appearing, no acute distress. Stable vitals aside from high blood pressure of 147/92.  Reporting mild pain in the right calf as well as increased swelling that he noticed earlier today.  On exam, he has mild tenderness palpation of the posterior  right calf and medial aspect of the right ankle.  Mild nonpitting edema of the right calf and medial aspect of the right ankle.  Dorsalis pedis pulses 2+ bilaterally.  He has eczema on bilateral lower extremities, but no wounds or areas of erythema concerning for infection.  Imaging Studies ordered: I ordered imaging studies including ultrasound right lower extremity I independently visualized the imaging with scope of interpretation limited to determining acute life threatening conditions related to emergency care. Imaging showed acute DVT involving the posterior tibial and peroneal veins I agree with the radiologist interpretation   Medications Given: None  Upon re-evaluation, patient still well-appearing with stable vitals.  Discussed results of ultrasound with patient.  Unclear as to the provoking factor to his DVT at this time, but could be sedentary lifestyle.  He is neurovascularly intact in the RLE with 2+ pedal pulses, stable vitals, pain well controlled, feel he is appropriate for  outpatient therapy at this time.  No concern for PE at this time given stable vitals, no chest pain or shortness of breath. He declines first dose of Eliquis here, states he will start it at home.  Appropriate for discharge home.    Impression: Acute DVT of right peroneal and posterior tibial veins  Disposition:  The patient was discharged home with instructions to take starter dose of Eliquis as prescribed.  Start this as soon as possible, ideally tonight.  Referral for DVT clinic was sent.  He understands to be on the look out for a call to schedule an appointment for follow-up with the DVT clinic.  If he does not receive a call from the DVT clinic within the next 3 days, contact information for vascular Dr. Susi Eric was provided.  He understands he would need to call to schedule an appointment with Dr. Susi Eric within the next week if he does not hear back from the DVT clinic. Return precautions  given.   This chart was dictated using voice recognition software, Dragon. Despite the best efforts of this provider to proofread and correct errors, errors may still occur which can change documentation meaning.          Final Clinical Impression(s) / ED Diagnoses Final diagnoses:  Acute deep vein thrombosis (DVT) of right peroneal vein (HCC)  Deep vein thrombosis (DVT) of right posterior tibial vein (HCC)    Rx / DC Orders ED Discharge Orders          Ordered    AMB Referral to Deep Vein Thrombosis Clinic        03/01/24 1704    apixaban (ELIQUIS) 5 MG TABS tablet        03/01/24 1707              Rexie Catena, PA-C 03/01/24 1720    Hershel Los, MD 03/01/24 2104

## 2024-03-02 ENCOUNTER — Other Ambulatory Visit: Payer: Self-pay | Admitting: Internal Medicine

## 2024-03-04 ENCOUNTER — Encounter: Payer: Self-pay | Admitting: Student-PharmD

## 2024-03-04 ENCOUNTER — Ambulatory Visit: Attending: Vascular Surgery | Admitting: Student-PharmD

## 2024-03-04 ENCOUNTER — Other Ambulatory Visit (HOSPITAL_COMMUNITY): Payer: Self-pay

## 2024-03-04 VITALS — BP 139/85 | HR 74 | Wt 216.0 lb

## 2024-03-04 DIAGNOSIS — I82451 Acute embolism and thrombosis of right peroneal vein: Secondary | ICD-10-CM | POA: Diagnosis not present

## 2024-03-04 MED ORDER — APIXABAN 5 MG PO TABS: 5.0000 mg | ORAL_TABLET | Freq: Two times a day (BID) | ORAL | 5 refills | Status: AC

## 2024-03-04 NOTE — Patient Instructions (Addendum)
-  Continue apixaban (Eliquis) 10 mg twice daily for 7 days followed by 5 mg twice daily. -Your refills have been sent to your pharmacy. You may need to call the pharmacy to ask them to fill this when you start to run low on your current supply.  -It is important to take your medication around the same time every day.  -Avoid NSAIDs like ibuprofen (Advil, Motrin) and naproxen (Aleve) as well as aspirin doses over 100 mg daily. -Tylenol (acetaminophen) is the preferred over the counter pain medication to lower the risk of bleeding. -Be sure to alert all of your health care providers that you are taking an anticoagulant prior to starting a new medication or having a procedure. -Monitor for signs and symptoms of bleeding (abnormal bruising, prolonged bleeding, nose bleeds, bleeding from gums, discolored urine, black tarry stools). If you have fallen and hit your head OR if your bleeding is severe or not stopping, seek emergency care.  -Go to the emergency room if emergent signs and symptoms of new clot occur (new or worse swelling and pain in an arm or leg, shortness of breath, chest pain, fast or irregular heartbeats, lightheadedness, dizziness, fainting, coughing up blood) or if you experience a significant color change (pale or blue) in the extremity that has the DVT.  -We recommend you wear compression stockings (20-30 mmHg) as long as you are having swelling or pain. Be sure to purchase the correct size and take them off at night.   If you have any questions or need to reschedule an appointment, please call [phone]. If you are having an emergency, call 911 or present to the nearest emergency room.   What is a DVT?  -Deep vein thrombosis (DVT) is a condition in which a blood clot forms in a vein of the deep venous system which can occur in the lower leg, thigh, pelvis, arm, or neck. This condition is serious and can be life-threatening if the clot travels to the arteries of the lungs and causing a  blockage (pulmonary embolism, PE). A DVT can also damage veins in the leg, which can lead to long-term venous disease, leg pain, swelling, discoloration, and ulcers or sores (post-thrombotic syndrome).  -Treatment may include taking an anticoagulant medication to prevent more clots from forming and the current clot from growing, wearing compression stockings, and/or surgical procedures to remove or dissolve the clot.

## 2024-03-04 NOTE — Progress Notes (Signed)
 DVT Clinic Note  Name: Nathan Calhoun     MRN: [id number]     DOB: 1966/01/08     Sex: male  PCP: Lonzie Robins, MD  Today's Visit: Visit Information: Initial Visit  Referred to DVT Clinic by: Emergency Department - Rexie Catena, PA-C Referred to CPP by: Dr. Vikki Graves Reason for referral:  Chief Complaint  Patient presents with   Med Management - DVT   HISTORY OF PRESENT ILLNESS: Nathan Calhoun is a 58 y.o. male with PMH HLD who presents for follow up medication management after diagnosis of DVT on 03/01/24. Patient presented to the ED that day reporting one day of right calf and ankle pain and swelling. Ultrasound showed DVT in the right posterior tibial and peroneal veins. He was started on Eliquis and referred to the DVT Clinic for follow up management. Today, patient reports that his RLE pain and swelling has improved since starting Eliquis. Denies abnormal bleeding or bruising. Denies missed doses of Eliquis, takes it at 6am and 6-7pm. Is not wearing compression stockings but has been elevating. No personal history of DVT. No known history in his family but reports that his father died unexpectedly and are unsure of the cause but they wondered if it could have been from a blood clot. Reports he is up to date on cancer screenings. He did drive to [place] a few weeks before his symptoms started but this was just a 2.5 hour drive and the return trip was one week later.   Positive Thrombotic Risk Factors: None Present Bleeding Risk Factors: Anticoagulant therapy  Negative Thrombotic Risk Factors: Previous VTE, Recent surgery (within 3 months), Recent trauma (within 3 months), Recent admission to hospital with acute illness (within 3 months), Paralysis, paresis, or recent plaster cast immobilization of lower extremity, Central venous catheterization, Bed rest >72 hours within 3 months, Sedentary journey lasting >8 hours within 4 weeks, Pregnancy, Within 6 weeks postpartum, Recent cesarean section  (within 3 months), Estrogen therapy, Testosterone therapy, Erythropoiesis-stimulating agent, Recent COVID diagnosis (within 3 months), Active cancer, Non-malignant, chronic inflammatory condition, Known thrombophilic condition, Smoking, Obesity, Older age  Rx Insurance Coverage: Commercial Rx Affordability: Eliquis is $297/month. He used the one time free card for the initial fill and has the copay card to reduce refills to $10/month.  Rx Assistance Provided:  Free 30-day trial card Co-pay card Preferred Pharmacy: Refills sent to his local pharmacy, [facility].   Past Medical History:  Diagnosis Date   Allergy to alpha-gal    Diverticulitis    Hyperlipidemia    Borderline   Premature ventricular contractions     Past Surgical History:  Procedure Laterality Date   MENISCUS REPAIR     Left   TONSILLECTOMY      Social History   Socioeconomic History   Marital status: Married    Spouse name: Not on file   Number of children: 3   Years of education: Not on file   Highest education level: Not on file  Occupational History   Occupation: Roofer  Tobacco Use   Smoking status: Never   Smokeless tobacco: Never  Vaping Use   Vaping status: Never Used  Substance and Sexual Activity   Alcohol  use: Not Currently    Alcohol /week: 12.0 standard drinks of alcohol     Types: 12 Glasses of wine per week    Comment: 3-8 beers per week   Drug use: Not Currently   Sexual activity: Not on file  Other Topics Concern  Not on file  Social History Narrative   Lives in [place]   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   Social Drivers of Corporate investment banker Strain: Not on file  Food Insecurity: Not on file  Transportation Needs: Not on file  Physical Activity: Not on file  Stress: Not on file  Social Connections: Not on file  Intimate Partner Violence: Not on file    Family History  Problem Relation Age of Onset   Hypertension  Mother    Hyperlipidemia Mother    Hypertension Father    Congenital heart disease Son        TGV   Colon cancer Neg Hx    Colon polyps Neg Hx    Esophageal cancer Neg Hx    Rectal cancer Neg Hx    Stomach cancer Neg Hx    Sleep apnea Neg Hx     Allergies as of 03/04/2024 - Review Complete 03/04/2024  Allergen Reaction Noted   Alpha-d-galactosidase [tilactase] Anaphylaxis, Diarrhea, Hives, Itching, Nausea And Vomiting, Rash, Shortness Of Breath, and Swelling 03/17/2021    Current Outpatient Medications on File Prior to Visit  Medication Sig Dispense Refill   apixaban (ELIQUIS) 5 MG TABS tablet Take 2 tablets (10mg ) twice daily for 7 days, then 1 tablet (5mg ) twice daily 60 tablet 0   clobetasol cream (TEMOVATE) 0.05 % 1 application Externally Twice a day for 30 days     nystatin-triamcinolone (MYCOLOG II) cream 1 application Externally Twice a day to feet     pantoprazole  (PROTONIX ) 20 MG tablet 1 tablet Orally Once a day for 90 days     rosuvastatin (CRESTOR) 40 MG tablet Take 40 mg by mouth daily.     No current facility-administered medications on file prior to visit.   REVIEW OF SYSTEMS:  Review of Systems  Respiratory:  Negative for shortness of breath.   Cardiovascular:  Positive for leg swelling. Negative for chest pain and palpitations.  Musculoskeletal:  Positive for myalgias.  Neurological:  Positive for tingling. Negative for dizziness.   PHYSICAL EXAMINATION:  Vitals:   03/04/24 1333  BP: 139/85  Pulse: 74  SpO2: 97%  Weight: 216 lb (98 kg)    Body mass index is 30.99 kg/m.  Physical Exam Vitals reviewed.  Cardiovascular:     Rate and Rhythm: Normal rate.  Pulmonary:     Effort: Pulmonary effort is normal.  Musculoskeletal:     Right lower leg: Edema (1+) present.     Left lower leg: No edema.  Skin:    Findings: No bruising or erythema.  Psychiatric:        Mood and Affect: Mood normal.        Behavior: Behavior normal.        Thought Content:  Thought content normal.   Villalta Score for Post-Thrombotic Syndrome: Pain: Mild Cramps: Absent Heaviness: Mild Paresthesia: Mild Pruritus: Absent Pretibial Edema: Mild Skin Induration: Absent Hyperpigmentation: Absent Redness: Absent Venous Ectasia: Absent Pain on calf compression: Absent Villalta Preliminary Score: 4 Is venous ulcer present?: No If venous ulcer is present and score is <15, then 15 points total are assigned: Absent Villalta Total Score: 4  LABS:  CBC     Component Value Date/Time   WBC 8.1 10/22/2022 1917   RBC 5.54 10/22/2022 1917   HGB 17.3 (H) 10/22/2022 1917   HCT 49.2 10/22/2022 1917   PLT 174 10/22/2022 1917   MCV 88.8  10/22/2022 1917   MCH 31.2 10/22/2022 1917   MCHC 35.2 10/22/2022 1917   RDW 12.2 10/22/2022 1917    Hepatic Function   No results found for: PROT, ALBUMIN, AST, ALT, ALKPHOS, BILITOT, BILIDIR, IBILI  Renal Function   Lab Results  Component Value Date   CREATININE 0.78 10/22/2022   CREATININE 0.7 04/20/2011    CrCl cannot be calculated (Patient's most recent lab result is older than the maximum 21 days allowed.).   [facility] Vascular Lab Studies:  03/01/24 DVT study IMPRESSION: Positive for acute occlusive calf vein DVT involving the posterior tibial and peroneal veins.  ASSESSMENT: Location of DVT: Right distal vein Cause of DVT: unprovoked  Patient without prior history of DVT diagnosed with acute DVT involving the right posterior tibial and peroneal veins on 03/01/24 in the ED and was started on Eliquis at that time. He has started to experience improvement in his pain and swelling since starting Eliquis, and he is tolerating Eliquis well. No barriers to adherence or access identified today. No provoking risk factors identified today. Will refer to hematology for further work up and management of duration of anticoagulation. Counseled patient extensively on Eliquis. Discussed importance of compression and elevation.  All of his questions have been answered at this time.   PLAN: -Continue apixaban (Eliquis) 10 mg twice daily for 7 days followed by 5 mg twice daily. -Expected duration of therapy: per hematology. Therapy started on 03/01/24. -Patient educated on purpose, proper use and potential adverse effects of apixaban (Eliquis). -Discussed importance of taking medication around the same time every day. -Advised patient of medications to avoid (NSAIDs, aspirin doses >100 mg daily). -Educated that Tylenol (acetaminophen) is the preferred analgesic to lower the risk of bleeding. -Advised patient to alert all providers of anticoagulation therapy prior to starting a new medication or having a procedure. -Emphasized importance of monitoring for signs and symptoms of bleeding (abnormal bruising, prolonged bleeding, nose bleeds, bleeding from gums, discolored urine, black tarry stools). -Educated patient to present to the ED if emergent signs and symptoms of new thrombosis occur. -Counseled patient to wear compression stockings daily, removing at night. Counseled on proper leg elevation.   Follow up: Referred to hematology. DVT Clinic available as needed.   Faye Hoops, PharmD, [place], CPP [facility] Clinical Pharmacist Practitioner [phone]

## 2024-03-09 NOTE — Progress Notes (Signed)
 [facility] [address] [place] [address]   Clinic Day:  03/10/2024  Referring physician: Avva, Ravisankar, MD  Patient Care Team: Avva, Ravisankar, MD as PCP - General (Internal Medicine)   ASSESSMENT & PLAN:   Assessment:  1.  Unprovoked right leg distal DVT: - He noticed swelling in the right calf and soreness in the morning of 02/29/2024. - He was evaluated in the ER on 03/01/2024. - Ultrasound Doppler of the right leg: Positive for acute occlusive calf vein DVT involving posterior tibial and peroneal veins. - 2 weeks prior, he had traveled to [place] by car for 3 hours.  No history of trauma/surgery/immobility/COVID-19 in the prior 3 months. - He was started on Eliquis  with improvement in his symptoms. - Colonoscopy 6 months ago with 1 polyp removed.  No B symptoms.  2. Social/Family History: -Has an active lifestyle. Works as an Furniture conservator/restorer. No tobacco use.  -No family history of cancer. No family history of DVT's or PE's.   Plan:  1.  Unprovoked right leg distal DVT: - Agree with starting on anticoagulation with Eliquis  as he has high risk features including unprovoked DVT, extensive thrombus involving multiple veins. - Recommend at least 3 months of anticoagulation. - Will repeat right leg Doppler along with D-dimer prior to next visit in 3 months.  Will also do hypercoagulable workup due to unprovoked nature of the thrombosis. - Recommend compression stockings as he has varicose veins.   Orders Placed This Encounter  Procedures   US  Venous Img Lower Unilateral Right    Standing Status:   Future    Expected Date:   06/10/2024    Expiration Date:   03/10/2025    Reason for Exam (SYMPTOM  OR DIAGNOSIS REQUIRED):   hx DVT    Preferred imaging location?:   [facility]   Cardiolipin antibodies, IgG, IgM, IgA    Standing Status:   Future    Number of Occurrences:   1    Expected Date:   03/10/2024    Expiration  Date:   06/08/2024   Factor 5 leiden    Standing Status:   Future    Number of Occurrences:   1    Expected Date:   03/10/2024    Expiration Date:   06/08/2024   Protein C activity    Standing Status:   Future    Number of Occurrences:   1    Expected Date:   03/10/2024    Expiration Date:   06/08/2024   Protein C, total    Standing Status:   Future    Number of Occurrences:   1    Expected Date:   03/10/2024    Expiration Date:   06/08/2024   Lupus anticoagulant panel    Standing Status:   Future    Number of Occurrences:   1    Expected Date:   03/10/2024    Expiration Date:   06/08/2024   Protein S activity    Standing Status:   Future    Number of Occurrences:   1    Expected Date:   03/10/2024    Expiration Date:   06/08/2024   Protein S, total    Standing Status:   Future    Number of Occurrences:   1    Expected Date:   03/10/2024    Expiration Date:   06/08/2024   D-dimer, quantitative    Standing Status:  Future    Number of Occurrences:   1    Expected Date:   03/10/2024    Expiration Date:   06/08/2024   Antithrombin III    Standing Status:   Future    Number of Occurrences:   1    Expected Date:   03/10/2024    Expiration Date:   06/08/2024   Prothrombin gene mutation    Standing Status:   Future    Number of Occurrences:   1    Expected Date:   03/10/2024    Expiration Date:   06/08/2024   Beta-2-glycoprotein i abs, IgG/M/A    Standing Status:   Future    Number of Occurrences:   1    Expected Date:   03/10/2024    Expiration Date:   06/08/2024   JAK2 genotypr    Standing Status:   Future    Number of Occurrences:   1    Expected Date:   03/10/2024    Expiration Date:   06/08/2024      Nathan Calhoun,acting as a scribe for Paulett Boros, MD.,have documented all relevant documentation on the behalf of Paulett Boros, MD,as directed by  Paulett Boros, MD while in the presence of Paulett Boros, MD.   I, Paulett Boros MD, have reviewed  the above documentation for accuracy and completeness, and I agree with the above.   Paulett Boros, MD   6/17/20259:57 AM  CHIEF COMPLAINT/PURPOSE OF CONSULT:   Diagnosis: Unprovoked right leg distal DVT  Current Therapy: Eliquis   HISTORY OF PRESENT ILLNESS:   Nathan Calhoun is a 58 y.o. male presenting to clinic today for evaluation of acute right peroneal and tibial DVT at the request of Faye Hoops, RPH-CPP.  Patient has a medical history of hyperlipidemia and alpha gal.   Nathan Calhoun presented to the ED on 03/01/24 for right leg swelling. Right lower extremity DVT study was positive for acute occlusive calf vein DVT involving the posterior tibial and peroneal veins. He was discharged with a starter dose of Eliquis  and a referral with the DVT clinic.   Nathan Calhoun was then seen in the DVT clinic on 03/04/24 to discuss risk factors of DVT and was referred to me to review duration of anticoagulation therapy.   Today, he states that he is doing well overall. His appetite level is at 100%. His energy level is at 75%. Nathan Calhoun denies any risk factor prior to having DVT, including a sedentary lifestyle, surgery, and being bed ridden. He does note a 3 hour car drive to [place] and back without breaks on the last week of May 2025. He denies any prior history of blood clots.   He reports right calf soreness the day before coming to the ED on 03/01/24. Nathan Calhoun notes some residual soreness and swelling today, though it has improved since starting Eliquis . He is tolerating Eliquis  well and denies any side effects. Nathan Calhoun has not been wearing compression socks since DVT diagnosis. He denies any unintentional weight loss, fevers, or night sweats.   Nathan Calhoun reports his father died suddenly and the cause of death was unknown, though his father was noted to have right calf soreness the day before he died.   His last colonoscopy was 6 months ago in [place] with 1 polyp being found.   Nathan Calhoun reports eczema on the  bilateral lower extremities with cracked skin on the heels and has an appintment with Dermatology in August 2025. Eczema was previously on the back.   PAST MEDICAL  HISTORY:   Past Medical History: Past Medical History:  Diagnosis Date   Allergy to alpha-gal    Diverticulitis    Hyperlipidemia    Borderline   Premature ventricular contractions     Surgical History: Past Surgical History:  Procedure Laterality Date   MENISCUS REPAIR     Left   TONSILLECTOMY      Social History: Social History   Socioeconomic History   Marital status: Married    Spouse name: Not on file   Number of children: 3   Years of education: Not on file   Highest education level: Not on file  Occupational History   Occupation: Roofer  Tobacco Use   Smoking status: Never   Smokeless tobacco: Never  Vaping Use   Vaping status: Never Used  Substance and Sexual Activity   Alcohol  use: Not Currently    Alcohol /week: 12.0 standard drinks of alcohol     Types: 12 Glasses of wine per week    Comment: 3-8 beers per week   Drug use: Not Currently   Sexual activity: Not on file  Other Topics Concern   Not on file  Social History Narrative   Lives in [place]   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   Social Drivers of Corporate investment banker Strain: Not on file  Food Insecurity: No Food Insecurity (03/10/2024)   Hunger Vital Sign    Worried About Running Out of Food in the Last Year: Never true    Ran Out of Food in the Last Year: Never true  Transportation Needs: No Transportation Needs (03/10/2024)   PRAPARE - Administrator, Civil Service (Medical): No    Lack of Transportation (Non-Medical): No  Physical Activity: Not on file  Stress: Not on file  Social Connections: Not on file  Intimate Partner Violence: Not At Risk (03/10/2024)   Humiliation, Afraid, Rape, and Kick questionnaire    Fear of Current or Ex-Partner: No    Emotionally  Abused: No    Physically Abused: No    Sexually Abused: No    Family History: Family History  Problem Relation Age of Onset   Hypertension Mother    Hyperlipidemia Mother    Hypertension Father    Congenital heart disease Son        TGV   Colon cancer Neg Hx    Colon polyps Neg Hx    Esophageal cancer Neg Hx    Rectal cancer Neg Hx    Stomach cancer Neg Hx    Sleep apnea Neg Hx     Current Medications:  Current Outpatient Medications:    apixaban  (ELIQUIS ) 5 MG TABS tablet, Take 2 tablets (10mg ) twice daily for 7 days, then 1 tablet (5mg ) twice daily, Disp: 60 tablet, Rfl: 0   apixaban  (ELIQUIS ) 5 MG TABS tablet, Take 1 tablet (5 mg total) by mouth 2 (two) times daily., Disp: 60 tablet, Rfl: 5   clobetasol cream (TEMOVATE) 0.05 %, 1 application Externally Twice a day for 30 days, Disp: , Rfl:    nystatin-triamcinolone (MYCOLOG II) cream, 1 application Externally Twice a day to feet, Disp: , Rfl:    rosuvastatin (CRESTOR) 40 MG tablet, Take 40 mg by mouth daily., Disp: , Rfl:    pantoprazole  (PROTONIX ) 20 MG tablet, 1 tablet Orally Once a day for 90 days (Patient not taking: Reported on 03/10/2024), Disp: , Rfl:    Allergies: Allergies  Allergen Reactions  Alpha-D-Galactosidase [Tilactase] Anaphylaxis, Diarrhea, Hives, Itching, Nausea And Vomiting, Rash, Shortness Of Breath and Swelling    REVIEW OF SYSTEMS:   Review of Systems  Constitutional:  Negative for chills, fatigue and fever.  HENT:   Negative for lump/mass, mouth sores, nosebleeds, sore throat and trouble swallowing.   Eyes:  Negative for eye problems.  Respiratory:  Negative for cough and shortness of breath.   Cardiovascular:  Negative for chest pain, leg swelling and palpitations.  Gastrointestinal:  Negative for abdominal pain, constipation, diarrhea, nausea and vomiting.  Genitourinary:  Negative for bladder incontinence, difficulty urinating, dysuria, frequency, hematuria and nocturia.   Musculoskeletal:   Negative for arthralgias, back pain, flank pain, myalgias and neck pain.       +right calf swelling and soreness  Skin:  Negative for itching and rash.  Neurological:  Negative for dizziness, headaches and numbness.  Hematological:  Does not bruise/bleed easily.  Psychiatric/Behavioral:  Positive for sleep disturbance. Negative for depression and suicidal ideas. The patient is not nervous/anxious.   All other systems reviewed and are negative.    VITALS:   Blood pressure (!) 144/90, pulse 78, temperature 98.3 F (36.8 C), temperature source Oral, resp. rate 16, height 5' 9 (1.753 m), weight 217 lb 13 oz (98.8 kg), SpO2 93%.  Wt Readings from Last 3 Encounters:  03/10/24 217 lb 13 oz (98.8 kg)  03/04/24 216 lb (98 kg)  12/03/22 219 lb (99.3 kg)    Body mass index is 32.17 kg/m.   PHYSICAL EXAM:   Physical Exam Vitals and nursing note reviewed. Exam conducted with a chaperone present.  Constitutional:      Appearance: Normal appearance.   Cardiovascular:     Rate and Rhythm: Normal rate and regular rhythm.     Pulses: Normal pulses.     Heart sounds: Normal heart sounds.  Pulmonary:     Effort: Pulmonary effort is normal.     Breath sounds: Normal breath sounds.  Abdominal:     Palpations: Abdomen is soft. There is no hepatomegaly, splenomegaly or mass.     Tenderness: There is no abdominal tenderness.   Musculoskeletal:     Right lower leg: No edema.     Left lower leg: No edema.  Lymphadenopathy:     Cervical: No cervical adenopathy.     Right cervical: No superficial, deep or posterior cervical adenopathy.    Left cervical: No superficial, deep or posterior cervical adenopathy.     Upper Body:     Right upper body: No supraclavicular or axillary adenopathy.     Left upper body: No supraclavicular or axillary adenopathy.   Neurological:     General: No focal deficit present.     Mental Status: He is alert and oriented to person, place, and time.   Psychiatric:         Mood and Affect: Mood normal.        Behavior: Behavior normal.     LABS:   CBC    Component Value Date/Time   WBC 8.1 10/22/2022 1917   RBC 5.54 10/22/2022 1917   HGB 17.3 (H) 10/22/2022 1917   HCT 49.2 10/22/2022 1917   PLT 174 10/22/2022 1917   MCV 88.8 10/22/2022 1917   MCH 31.2 10/22/2022 1917   MCHC 35.2 10/22/2022 1917   RDW 12.2 10/22/2022 1917    CMP    Component Value Date/Time   NA 136 10/22/2022 1917   K 3.8 10/22/2022 1917   CL 103 10/22/2022  1917   CO2 24 10/22/2022 1917   GLUCOSE 104 (H) 10/22/2022 1917   BUN 16 10/22/2022 1917   CREATININE 0.78 10/22/2022 1917   CALCIUM 9.5 10/22/2022 1917   GFRNONAA >60 10/22/2022 1917    No results found for: CEA1, CEA / No results found for: CEA1, CEA No results found for: PSA1 No results found for: VWU981 No results found for: CAN125  No results found for: TOTALPROTELP, ALBUMINELP, A1GS, A2GS, BETS, BETA2SER, GAMS, MSPIKE, SPEI No results found for: TIBC, FERRITIN, IRONPCTSAT No results found for: LDH   STUDIES:   US  Venous Img Lower Right (DVT Study) Result Date: 03/01/2024 CLINICAL DATA:  Right calf and ankle pain and swelling for the past day EXAM: RIGHT LOWER EXTREMITY VENOUS DOPPLER ULTRASOUND TECHNIQUE: Gray-scale sonography with graded compression, as well as color Doppler and duplex ultrasound were performed to evaluate the lower extremity deep venous systems from the level of the common femoral vein and including the common femoral, femoral, profunda femoral, popliteal and calf veins including the posterior tibial, peroneal and gastrocnemius veins when visible. The superficial great saphenous vein was also interrogated. Spectral Doppler was utilized to evaluate flow at rest and with distal augmentation maneuvers in the common femoral, femoral and popliteal veins. COMPARISON:  None Available. FINDINGS: Contralateral Common Femoral Vein: Respiratory phasicity is  normal and symmetric with the symptomatic side. No evidence of thrombus. Normal compressibility. Common Femoral Vein: No evidence of thrombus. Normal compressibility, respiratory phasicity and response to augmentation. Saphenofemoral Junction: No evidence of thrombus. Normal compressibility and flow on color Doppler imaging. Profunda Femoral Vein: No evidence of thrombus. Normal compressibility and flow on color Doppler imaging. Femoral Vein: No evidence of thrombus. Normal compressibility, respiratory phasicity and response to augmentation. Popliteal Vein: No evidence of thrombus. Normal compressibility, respiratory phasicity and response to augmentation. Calf Veins: The posterior tibial and peroneal veins are thrombosed. The vessels are not compressible in their expanded in the lumen is filled with low-level internal echoes. No color flow visualized on color Doppler imaging. Superficial Great Saphenous Vein: No evidence of thrombus. Normal compressibility. Venous Reflux:  None. Other Findings:  None. IMPRESSION: Positive for acute occlusive calf vein DVT involving the posterior tibial and peroneal veins. Electronically Signed   By: Fernando Hoyer M.D.   On: 03/01/2024 15:59

## 2024-03-10 ENCOUNTER — Inpatient Hospital Stay

## 2024-03-10 ENCOUNTER — Encounter: Payer: Self-pay | Admitting: Hematology

## 2024-03-10 ENCOUNTER — Other Ambulatory Visit: Payer: Self-pay

## 2024-03-10 ENCOUNTER — Inpatient Hospital Stay: Attending: Hematology | Admitting: Hematology

## 2024-03-10 VITALS — BP 144/90 | HR 78 | Temp 98.3°F | Resp 16 | Ht 69.0 in | Wt 217.8 lb

## 2024-03-10 DIAGNOSIS — I82451 Acute embolism and thrombosis of right peroneal vein: Secondary | ICD-10-CM | POA: Diagnosis not present

## 2024-03-10 DIAGNOSIS — Z7901 Long term (current) use of anticoagulants: Secondary | ICD-10-CM | POA: Insufficient documentation

## 2024-03-10 DIAGNOSIS — I82441 Acute embolism and thrombosis of right tibial vein: Secondary | ICD-10-CM | POA: Insufficient documentation

## 2024-03-10 DIAGNOSIS — Z79899 Other long term (current) drug therapy: Secondary | ICD-10-CM | POA: Diagnosis not present

## 2024-03-10 DIAGNOSIS — I82401 Acute embolism and thrombosis of unspecified deep veins of right lower extremity: Secondary | ICD-10-CM

## 2024-03-10 NOTE — Patient Instructions (Signed)
 You were seen and examined today by Dr. Cheree Cords. Dr. Cheree Cords is a hematologist, meaning that he specializes in blood abnormalities. Dr. Cheree Cords discussed your past medical history, family history of cancers/blood conditions and the events that led to you being here today.  You were referred to Dr. Cheree Cords due to deep vein thrombosis (DVT - blood clot in the leg).  Dr. Katragadda has recommended additional labs today for further evaluation.  Follow-up as scheduled.

## 2024-03-24 DIAGNOSIS — L308 Other specified dermatitis: Secondary | ICD-10-CM | POA: Diagnosis not present

## 2024-05-06 DIAGNOSIS — I824Z1 Acute embolism and thrombosis of unspecified deep veins of right distal lower extremity: Secondary | ICD-10-CM | POA: Diagnosis not present

## 2024-05-14 DIAGNOSIS — M79672 Pain in left foot: Secondary | ICD-10-CM | POA: Diagnosis not present

## 2024-05-14 DIAGNOSIS — M79671 Pain in right foot: Secondary | ICD-10-CM | POA: Diagnosis not present

## 2024-05-14 DIAGNOSIS — R234 Changes in skin texture: Secondary | ICD-10-CM | POA: Diagnosis not present

## 2024-05-28 ENCOUNTER — Ambulatory Visit (HOSPITAL_COMMUNITY)
Admission: RE | Admit: 2024-05-28 | Discharge: 2024-05-28 | Disposition: A | Discharge status: Home / Self Care | Source: Ambulatory Visit | Attending: Hematology | Admitting: Hematology

## 2024-05-28 ENCOUNTER — Inpatient Hospital Stay: Attending: Hematology

## 2024-05-28 DIAGNOSIS — I82441 Acute embolism and thrombosis of right tibial vein: Secondary | ICD-10-CM | POA: Diagnosis not present

## 2024-05-28 DIAGNOSIS — I82401 Acute embolism and thrombosis of unspecified deep veins of right lower extremity: Secondary | ICD-10-CM | POA: Insufficient documentation

## 2024-05-28 DIAGNOSIS — Z7901 Long term (current) use of anticoagulants: Secondary | ICD-10-CM | POA: Insufficient documentation

## 2024-05-28 DIAGNOSIS — Z79899 Other long term (current) drug therapy: Secondary | ICD-10-CM | POA: Diagnosis not present

## 2024-05-28 DIAGNOSIS — I82541 Chronic embolism and thrombosis of right tibial vein: Secondary | ICD-10-CM | POA: Diagnosis not present

## 2024-05-28 DIAGNOSIS — Z86718 Personal history of other venous thrombosis and embolism: Secondary | ICD-10-CM | POA: Insufficient documentation

## 2024-05-28 LAB — D-DIMER, QUANTITATIVE: D-Dimer, Quant: 0.49 ug{FEU}/mL (ref 0.00–0.50)

## 2024-05-28 LAB — ANTITHROMBIN III: AntiThromb III Func: 83 % (ref 75–120)

## 2024-05-29 LAB — PROTEIN C ACTIVITY: Protein C Activity: 111 % (ref 73–180)

## 2024-05-29 LAB — PROTEIN S ACTIVITY: Protein S Activity: 96 % (ref 63–140)

## 2024-05-29 LAB — PROTEIN S, TOTAL: Protein S Ag, Total: 90 % (ref 60–150)

## 2024-05-29 LAB — LUPUS ANTICOAGULANT PANEL
DRVVT: 40.1 s (ref 0.0–47.0)
PTT Lupus Anticoagulant: 34.4 s (ref 0.0–43.5)

## 2024-05-30 LAB — CARDIOLIPIN ANTIBODIES, IGG, IGM, IGA
Anticardiolipin IgA: <9 U/mL (ref 0–11)
Anticardiolipin IgG: <9 GPL U/mL (ref 0–14)
Anticardiolipin IgM: <9 [MPL'U]/mL (ref 0–12)

## 2024-05-30 LAB — BETA-2-GLYCOPROTEIN I ABS, IGG/M/A
Beta-2 Glyco I IgG: <9 GPI IgG units (ref 0–20)
Beta-2-Glycoprotein I IgA: <9 GPI IgA units (ref 0–25)
Beta-2-Glycoprotein I IgM: <9 GPI IgM units (ref 0–32)

## 2024-05-31 LAB — PROTEIN C, TOTAL: Protein C, Total: 64 % (ref 60–150)

## 2024-06-02 LAB — FACTOR 5 LEIDEN

## 2024-06-03 LAB — PROTHROMBIN GENE MUTATION

## 2024-06-03 LAB — JAK2 GENOTYPR

## 2024-06-11 NOTE — Progress Notes (Signed)
 Patient Care Team: Avva, Ravisankar, MD as PCP - General (Internal Medicine)  Clinic Day:  06/12/2024  Referring physician: Janey Santos, MD   CHIEF COMPLAINT:  CC: Unprovoked right leg distal DVT   Nathan Calhoun 58 y.o. male was transferred to my care after his prior physician has left.   ASSESSMENT & PLAN:   Assessment & Plan: Nathan Calhoun  is a 58 y.o. male with unprovoked DVT  Assessment & Plan Acute deep vein thrombosis (DVT) of distal vein of right lower extremity (HCC) Date: 03/02/2023 1st episode: Acute occlusive calf vein DVT involving the posterior tibial and peroneal veins Risk factors: No smoking, no surgery, recent travel to [place] 2 weeks prior to the episode which was a 3-hour drive each way, no sedentary lifestyle Cancer screening: Per documentation colonoscopy was done 6 months ago and 1 polyp was removed Current treatment: Eliquis  5mg  BID Thrombophilia workup: Anticardiolipin antibody, antibeta 2 glycoprotein antibody: Negative, Antithrombin activity: Normal, factor V Leiden, prothrombin gene mutation: Negative, protein S and protein C normal, lupus anticoagulant: Negative(done while on Eliquis ), JAK2 mutation: Negative Repeat ultrasound of the lower extremity: Resolution of the right peroneal vein DVT.  1 posterior tibial vein remains occluded with chronic appearing echogenic thrombus. D-dimer: Normal  -Considering patient has unprovoked clot in a male, I would recommend continuing anticoagulation for lifetime. -Thrombophilia workup is negative -Continue Eliquis  5 mg twice daily - Ensure primary care physician manages anticoagulation therapy and refills. - Educated on adherence to twice-daily dosing of Eliquis . - Advised on bleeding risk precautions. - Instructed to hold Eliquis  before major surgeries or procedures with provider guidance. - Discharged from hematology clinic with option to contact if questions arise.    The patient understands the  plans discussed today and is in agreement with them.  He knows to contact our office if he develops concerns prior to his next appointment.  30 minutes of total time was spent for this patient encounter, including preparation,review of records,  face-to-face counseling with the patient and coordination of care, physical exam, and documentation of the encounter.    Nathan Calhoun,acting as a Neurosurgeon for Nathan Dry, MD.,have documented all relevant documentation on the behalf of Nathan Dry, MD,as directed by  Nathan Dry, MD while in the presence of Nathan Dry, MD.  I, Nathan Dry MD, have reviewed the above documentation for accuracy and completeness, and I agree with the above.     Nathan Dry, MD  [facility] [place] - A DEPT OF [facility]: [place] [address] [place] [address] Dept: [phone] Dept Fax: [phone]   No orders of the defined types were placed in this encounter.    HEMATOLOGIC HISTORY:   I have reviewed his chart and materials related to his cancer extensively and collaborated history with the patient. Summary of oncologic history is as follows:   Diagnosis: Unprovoked right leg distal DVT   -02/29/2024: Initial Presentation of swelling and soreness in the right calf and presented to the ED the day after -03/01/2024: Doppler US  of right lover extremity: Positive for acute occlusive calf vein DVT involving posterior tibial and peroneal veins.  -03/01/2024-Current: Eliquis  5mg  twice daily -05/28/2024: Doppler US  of right lower extremity: Resolution of right peroneal vein DVT. One posterior tibial vein remains occluded with chronic appearing echogenic thrombus. -05/28/2024: Anticardiolipin antibody, antibeta 2 glycoprotein antibody: Negative, Antithrombin activity: Normal, factor V Leiden, prothrombin gene mutation: Negative, protein S and protein C  normal, lupus anticoagulant:  Negative(done while on Eliquis ), JAK2 mutation: Negative   Current Treatment:  Eliquis  5mg  twice daily  INTERVAL HISTORY:   Nathan Calhoun is here today for follow up and to establish care with me for unprovoked distal right leg DVT.   He experienced sudden onset pain and swelling in his right leg, which led him to seek emergency care. This occurred overnight on a Saturday, prompting him to visit the emergency room the following Sunday morning. The swelling was significant and described as 'overnight swelling.'  He had traveled to [place] two weeks prior to the event for his daughter's wedding, a trip that involved a three-hour drive each way without stops. There were no changes in his daily routine, and he remained active. He has no family history of blood clots and does not smoke. He had knee surgery on his left leg years ago, but no surgeries on the right leg.  A comprehensive work-up was conducted, including tests for hereditary conditions, all of which returned negative. A D-dimer test performed after three months of treatment was negative. An ultrasound showed that most of the blood clot had dissolved, with only one remaining.  He is currently on Eliquis , initially prescribed at a dose of 5 mg twice daily. However, he inadvertently reduced his intake to once daily during his second bottle. He has five refills available and plans to manage future prescriptions through his primary care provider.  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   I have reviewed the past medical history, past surgical history, social history and family history with the patient and they are unchanged from previous note.  ALLERGIES:  is allergic to alpha-d-galactosidase [tilactase].  MEDICATIONS:  Current Outpatient Medications  Medication Sig Dispense Refill   apixaban   (ELIQUIS ) 5 MG TABS tablet Take 2 tablets (10mg ) twice daily for 7 days, then 1 tablet (5mg ) twice daily 60 tablet 0   apixaban  (ELIQUIS ) 5 MG TABS tablet Take 1 tablet (5 mg total) by mouth 2 (two) times daily. 60 tablet 5   clobetasol cream (TEMOVATE) 0.05 % 1 application Externally Twice a day for 30 days     halobetasol (ULTRAVATE) 0.05 % cream Apply topically 2 (two) times daily as needed.     nystatin-triamcinolone (MYCOLOG II) cream 1 application Externally Twice a day to feet     pantoprazole  (PROTONIX ) 20 MG tablet 1 tablet Orally Once a day for 90 days     rosuvastatin (CRESTOR) 40 MG tablet Take 40 mg by mouth daily.     No current facility-administered medications for this visit.    REVIEW OF SYSTEMS:   Constitutional: Denies fevers, chills or abnormal weight loss Eyes: Denies blurriness of vision Ears, nose, mouth, throat, and face: Denies mucositis or sore throat Respiratory: Denies cough, dyspnea or wheezes Cardiovascular: Denies palpitation, chest discomfort or lower extremity swelling Gastrointestinal:  Denies nausea, heartburn or change in bowel habits Skin: Denies abnormal skin rashes Lymphatics: Denies new lymphadenopathy or easy bruising Neurological:Denies numbness, tingling or new weaknesses Behavioral/Psych: Mood is stable, no new changes  All other systems were reviewed with the patient and are negative.   VITALS:  Blood pressure (!) 143/86, pulse 73, temperature 98.1 F (36.7 C), temperature source Oral, resp. rate 18, weight 230 lb (104.3 kg), SpO2 95%.  Wt Readings from Last 3 Encounters:  06/12/24  230 lb (104.3 kg)  03/10/24 217 lb 13 oz (98.8 kg)  03/04/24 216 lb (98 kg)    Body mass index is 33.97 kg/m.  Performance status (ECOG): 0 - Asymptomatic  PHYSICAL EXAM:   GENERAL:alert, no distress and comfortable SKIN: skin color, texture, turgor are normal, no rashes or significant lesions LYMPH:  no palpable lymphadenopathy in the cervical,  axillary or inguinal LUNGS: clear to auscultation and percussion with normal breathing effort HEART: regular rate & rhythm and no murmurs and no lower extremity edema ABDOMEN:abdomen soft, non-tender and normal bowel sounds Musculoskeletal:no cyanosis of digits and no clubbing  NEURO: alert & oriented x 3 with fluent speech, no focal motor/sensory deficits  LABORATORY DATA:  I have reviewed the data as listed  Lab Results  Component Value Date   WBC 8.1 10/22/2022   HGB 17.3 (H) 10/22/2022   HCT 49.2 10/22/2022   MCV 88.8 10/22/2022   PLT 174 10/22/2022     Chemistry      Component Value Date/Time   NA 136 10/22/2022 1917   K 3.8 10/22/2022 1917   CL 103 10/22/2022 1917   CO2 24 10/22/2022 1917   BUN 16 10/22/2022 1917   CREATININE 0.78 10/22/2022 1917      Component Value Date/Time   CALCIUM 9.5 10/22/2022 1917       Latest Reference Range & Units 05/28/24 07:56  Anticardiolipin Ab,IgA,Qn 0 - 11 APL U/mL <9  Anticardiolipin Ab,IgG,Qn 0 - 14 GPL U/mL <9  Anticardiolipin Ab,IgM,Qn 0 - 12 MPL U/mL <9  PTT Lupus Anticoagulant 0.0 - 43.5 sec 34.4  DRVVT 0.0 - 47.0 sec 40.1  Lupus Anticoag Interp  Comment: (C)  Beta-2  Glycoprotein I Ab, IgG 0 - 20 GPI IgG units <9  Beta-2 -Glycoprotein I IgA 0 - 25 GPI IgA units <9  Beta-2 -Glycoprotein I IgM 0 - 32 GPI IgM units <9  Antithrombin Activity 75 - 120 % 83  D-Dimer, Quant 0.00 - 0.50 ug/mL-FEU 0.49  Recommendations-F5LEID:  Negative  Recommendations-PTGENE:  Negative  Protein C-Functional 73 - 180 % 111  Protein C, Total 60 - 150 % 64  Protein S-Functional 63 - 140 % 96  Protein S, Total 60 - 150 % 90  JAK2 GenotypR  Negative  (C): Corrected  RADIOGRAPHIC STUDIES: I have personally reviewed the radiological images as listed and agreed with the findings in the report.  US  Venous Img Lower Unilateral Right CLINICAL DATA:  Follow-up of right lower extremity DVT involving posterior tibial and peroneal veins.  EXAM: RIGHT  LOWER EXTREMITY VENOUS DOPPLER ULTRASOUND  TECHNIQUE: Gray-scale sonography with graded compression, as well as color Doppler and duplex ultrasound were performed to evaluate the lower extremity deep venous systems from the level of the common femoral vein and including the common femoral, femoral, profunda femoral, popliteal and calf veins including the posterior tibial, peroneal and gastrocnemius veins when visible. The superficial great saphenous vein was also interrogated. Spectral Doppler was utilized to evaluate flow at rest and with distal augmentation maneuvers in the common femoral, femoral and popliteal veins.  COMPARISON:  03/01/2024  FINDINGS: Contralateral Common Femoral Vein: Respiratory phasicity is normal and symmetric with the symptomatic side. No evidence of thrombus. Normal compressibility.  Common Femoral Vein: No evidence of thrombus. Normal compressibility, respiratory phasicity and response to augmentation.  Saphenofemoral Junction: No evidence of thrombus. Normal compressibility and flow on color Doppler imaging.  Profunda Femoral Vein: No evidence of thrombus. Normal compressibility and flow on color Doppler imaging.  Femoral Vein: No evidence of thrombus. Normal compressibility, respiratory phasicity and response to augmentation.  Popliteal Vein: No evidence of thrombus. Normal compressibility, respiratory phasicity and response to augmentation.  Calf Veins: Peroneal vein is recanalized and normally patent with resolution of previously seen DVT. One posterior tibial vein remains occluded with chronic appearing echogenic thrombus.  Superficial Great Saphenous Vein: No evidence of thrombus. Normal compressibility.  Venous Reflux:  None.  Other Findings: No evidence of superficial thrombophlebitis or abnormal fluid collection.  IMPRESSION: Resolution of right peroneal vein DVT. One posterior tibial vein remains occluded with chronic appearing  echogenic thrombus.  Electronically Signed   By: Marcey Moan M.D.   On: 05/28/2024 08:44

## 2024-06-12 ENCOUNTER — Ambulatory Visit: Admitting: Oncology

## 2024-06-12 ENCOUNTER — Inpatient Hospital Stay: Admitting: Oncology

## 2024-06-12 VITALS — BP 143/86 | HR 73 | Temp 98.1°F | Resp 18 | Wt 230.0 lb

## 2024-06-12 DIAGNOSIS — I824Z1 Acute embolism and thrombosis of unspecified deep veins of right distal lower extremity: Secondary | ICD-10-CM

## 2024-06-12 DIAGNOSIS — Z79899 Other long term (current) drug therapy: Secondary | ICD-10-CM | POA: Diagnosis not present

## 2024-06-12 DIAGNOSIS — I82441 Acute embolism and thrombosis of right tibial vein: Secondary | ICD-10-CM | POA: Diagnosis not present

## 2024-06-12 DIAGNOSIS — Z86718 Personal history of other venous thrombosis and embolism: Secondary | ICD-10-CM | POA: Diagnosis not present

## 2024-06-12 DIAGNOSIS — I82409 Acute embolism and thrombosis of unspecified deep veins of unspecified lower extremity: Secondary | ICD-10-CM | POA: Insufficient documentation

## 2024-06-12 DIAGNOSIS — Z7901 Long term (current) use of anticoagulants: Secondary | ICD-10-CM

## 2024-06-12 DIAGNOSIS — O2230 Deep phlebothrombosis in pregnancy, unspecified trimester: Secondary | ICD-10-CM

## 2024-06-12 NOTE — Assessment & Plan Note (Signed)
 Date: 03/02/2023 1st episode: Acute occlusive calf vein DVT involving the posterior tibial and peroneal veins Risk factors: No smoking, no surgery, recent travel to [place] 2 weeks prior to the episode which was a 3-hour drive each way, no sedentary lifestyle Cancer screening: Per documentation colonoscopy was done 6 months ago and 1 polyp was removed Current treatment: Eliquis  5mg  BID Thrombophilia workup: Anticardiolipin antibody, antibeta 2 glycoprotein antibody: Negative, Antithrombin activity: Normal, factor V Leiden, prothrombin gene mutation: Negative, protein S and protein C normal, lupus anticoagulant: Negative(done while on Eliquis ), JAK2 mutation: Negative Repeat ultrasound of the lower extremity: Resolution of the right peroneal vein DVT.  1 posterior tibial vein remains occluded with chronic appearing echogenic thrombus. D-dimer: Normal  -Considering patient has unprovoked clot in a male, I would recommend continuing anticoagulation for lifetime. -Thrombophilia workup is negative -Continue Eliquis  5 mg twice daily - Ensure primary care physician manages anticoagulation therapy and refills. - Educated on adherence to twice-daily dosing of Eliquis . - Advised on bleeding risk precautions. - Instructed to hold Eliquis  before major surgeries or procedures with provider guidance. - Discharged from hematology clinic with option to contact if questions arise.

## 2024-06-19 DIAGNOSIS — L408 Other psoriasis: Secondary | ICD-10-CM | POA: Diagnosis not present

## 2024-06-19 DIAGNOSIS — D489 Neoplasm of uncertain behavior, unspecified: Secondary | ICD-10-CM | POA: Diagnosis not present

## 2024-06-19 DIAGNOSIS — L219 Seborrheic dermatitis, unspecified: Secondary | ICD-10-CM | POA: Diagnosis not present

## 2024-06-25 DIAGNOSIS — M79671 Pain in right foot: Secondary | ICD-10-CM | POA: Diagnosis not present

## 2024-06-25 DIAGNOSIS — R234 Changes in skin texture: Secondary | ICD-10-CM | POA: Diagnosis not present

## 2024-06-25 DIAGNOSIS — M79672 Pain in left foot: Secondary | ICD-10-CM | POA: Diagnosis not present

## 2024-06-29 DIAGNOSIS — T148XXA Other injury of unspecified body region, initial encounter: Secondary | ICD-10-CM | POA: Diagnosis not present

## 2024-06-29 DIAGNOSIS — L409 Psoriasis, unspecified: Secondary | ICD-10-CM | POA: Diagnosis not present

## 2024-06-29 DIAGNOSIS — Z5181 Encounter for therapeutic drug level monitoring: Secondary | ICD-10-CM | POA: Diagnosis not present

## 2024-07-09 ENCOUNTER — Encounter (HOSPITAL_COMMUNITY): Payer: Self-pay | Admitting: Emergency Medicine

## 2024-07-09 ENCOUNTER — Emergency Department (HOSPITAL_COMMUNITY)
Admission: EM | Admit: 2024-07-09 | Discharge: 2024-07-09 | Disposition: A | Discharge status: Home / Self Care | Source: Ambulatory Visit | Attending: Emergency Medicine | Admitting: Emergency Medicine

## 2024-07-09 ENCOUNTER — Other Ambulatory Visit: Payer: Self-pay

## 2024-07-09 ENCOUNTER — Emergency Department (HOSPITAL_COMMUNITY)

## 2024-07-09 DIAGNOSIS — Z23 Encounter for immunization: Secondary | ICD-10-CM | POA: Diagnosis not present

## 2024-07-09 DIAGNOSIS — Y9301 Activity, walking, marching and hiking: Secondary | ICD-10-CM | POA: Diagnosis not present

## 2024-07-09 DIAGNOSIS — S0003XA Contusion of scalp, initial encounter: Secondary | ICD-10-CM | POA: Insufficient documentation

## 2024-07-09 DIAGNOSIS — W228XXA Striking against or struck by other objects, initial encounter: Secondary | ICD-10-CM | POA: Diagnosis not present

## 2024-07-09 DIAGNOSIS — S0990XA Unspecified injury of head, initial encounter: Secondary | ICD-10-CM | POA: Diagnosis not present

## 2024-07-09 DIAGNOSIS — Z7901 Long term (current) use of anticoagulants: Secondary | ICD-10-CM | POA: Diagnosis not present

## 2024-07-09 MED ORDER — TETANUS-DIPHTH-ACELL PERTUSSIS 5-2-15.5 LF-MCG/0.5 IM SUSP
0.5000 mL | Freq: Once | INTRAMUSCULAR | Status: AC
  Administered 2024-07-09: 0.5 mL via INTRAMUSCULAR
  Filled 2024-07-09: qty 0.5

## 2024-07-09 NOTE — ED Triage Notes (Signed)
 Pt struck the top of his head walking out of a trailer. Pt denies LOC. Takes eliquis .

## 2024-07-09 NOTE — ED Notes (Signed)
 Pt in bed, pt states that he is ready to go home, read and reviewed d/c instructions and follow up, advised to return for any concerns or worsening symptoms. Pt ambulatory from department with steady gait.

## 2024-07-09 NOTE — ED Provider Notes (Signed)
 [place] EMERGENCY DEPARTMENT AT [facility] Provider Note   CSN: [id number] Arrival date & time: 07/09/24  1134     Patient presents with: Head Injury   Nathan Calhoun is a 58 y.o. male.   [name]  who presents emergency department head injury.  Patient reports that he was walking out of the trailer and hit his head.  No loss of consciousness.  No vomiting.  Was walking when it happened.  No neck pain.  Has been behaving himself.  Last took Eliquis  at 7:30 AM and since he is on a blood thinner wanted to come in for evaluation.  Unsure last Tdap.       Prior to Admission medications   Medication Sig Start Date End Date Taking? Authorizing Provider  apixaban  (ELIQUIS ) 5 MG TABS tablet Take 2 tablets (10mg ) twice daily for 7 days, then 1 tablet (5mg ) twice daily 03/01/24   Veta Palma, PA-C  apixaban  (ELIQUIS ) 5 MG TABS tablet Take 1 tablet (5 mg total) by mouth 2 (two) times daily. 03/04/24   Barbarann Dixon B, RPH-CPP  clobetasol cream (TEMOVATE) 0.05 % 1 application Externally Twice a day for 30 days    [provider]  halobetasol (ULTRAVATE) 0.05 % cream Apply topically 2 (two) times daily as needed. 03/24/24   [provider]  nystatin-triamcinolone (MYCOLOG II) cream 1 application Externally Twice a day to feet    [provider]  pantoprazole  (PROTONIX ) 20 MG tablet 1 tablet Orally Once a day for 90 days    [provider]  rosuvastatin (CRESTOR) 40 MG tablet Take 40 mg by mouth daily. 01/22/24   [provider]    Allergies: Alpha-d-galactosidase [tilactase]    Review of Systems  Updated Vital Signs BP (!) 143/92 (BP Location: Left Arm)   Pulse 71   Temp 97.8 F (36.6 C) (Oral)   Resp 18   Ht 5' 9 (1.753 m)   Wt 105 kg   SpO2 94%   BMI 34.18 kg/m   Physical Exam HENT:     Head:     Comments: Small abrasion to his scalp.  Small hematoma forming under it.  Head is atraumatic otherwise.     Right Ear: External ear normal.     Left Ear: External ear normal.  Eyes:     Extraocular Movements: Extraocular movements intact.     Conjunctiva/sclera: Conjunctivae normal.     Pupils: Pupils are equal, round, and reactive to light.  Neck:     Comments: No C-spine midline tenderness to palpation Neurological:     General: No focal deficit present.     Cranial Nerves: No cranial nerve deficit.     Motor: No weakness.     (all labs ordered are listed, but only abnormal results are displayed) Labs Reviewed - No data to display  EKG: None  Radiology: CT Head Wo Contrast Result Date: 07/09/2024 EXAM: CT HEAD WITHOUT CONTRAST 07/09/2024 12:21:01 PM TECHNIQUE: CT of the head was performed without the administration of intravenous contrast. Automated exposure control, iterative reconstruction, and/or weight based adjustment of the mA/kV was utilized to reduce the radiation dose to as low as reasonably achievable. COMPARISON: None available. CLINICAL HISTORY: head injury, eliquis  use. pt struck the top of his head walking out of a trailer. Pt denies LOC. Takes eliquis . FINDINGS: BRAIN AND VENTRICLES: No acute hemorrhage. No evidence of acute infarct. No hydrocephalus. No extra-axial collection. No mass effect or midline shift. ORBITS: No  acute abnormality. SINUSES: No acute abnormality. SOFT TISSUES AND SKULL: No acute soft tissue abnormality. No skull fracture. IMPRESSION: 1. No acute intracranial abnormality. Electronically signed by: Donnice Mania MD 07/09/2024 12:38 PM EDT RP Workstation: [id number]     Procedures   Medications Ordered in the ED  Tdap (ADACEL) injection 0.5 mL (0.5 mLs Intramuscular Given 07/09/24 1153)                                    Medical Decision Making Amount and/or Complexity of Data Reviewed Radiology: ordered.  Risk Prescription drug management.   [name]  who presents emergency department after head trauma  Initial Ddx:   TBI, concussion, C-spine injury  MDM/Course:  Patient presents emergency department after minor head trauma.  Is high risk because of anticoagulation. Minor abrasion and hematoma to scalp on exam. CT head without acute malady.  No symptoms of concussion at this point in time.  No C-spine tenderness to palpation or concern for C-spine injury.  Tetanus shot was updated because it has been approx 10 years since his last Tdap.    This patient presents to the ED for concern of complaints listed in HPI, this involves an extensive number of treatment options, and is a complaint that carries with it a high risk of complications and morbidity. Disposition including potential need for admission considered.   Dispo: DC Home. Return precautions discussed including, but not limited to, those listed in the AVS. Allowed pt time to ask questions which were answered fully prior to dc.  Additional history obtained from spouse Records reviewed Outpatient Clinic Notes I independently reviewed the following imaging with scope of interpretation limited to determining acute life threatening conditions related to emergency care: CT Head and agree with the radiologist interpretation with the following exceptions: none I have reviewed the patients home medications and made adjustments as needed  Portions of this note were generated with Dragon dictation software. Dictation errors may occur despite best attempts at proofreading.     Final diagnoses:  Injury of head, initial encounter  Anticoagulated    ED Discharge Orders     None          Yolande Lamar BROCKS, MD 07/09/24 [phone]

## 2024-07-09 NOTE — Discharge Instructions (Signed)
 You were seen for head injury in the emergency department.  Your CT did not show a traumatic brain injury.  At home, please ice your head and take Tylenol for the pain.    Check your MyChart online for the results of any tests that had not resulted by the time you left the emergency department.   Follow-up with your primary doctor in 2-3 days regarding your visit.    Return immediately to the emergency department if you experience any of the following: Severe headache, vomiting, confusion, or any other concerning symptoms.    Thank you for visiting our Emergency Department. It was a pleasure taking care of you today.

## 2024-07-10 ENCOUNTER — Ambulatory Visit: Admitting: Oncology
